# Patient Record
Sex: Female | Born: 1995 | Race: White | Hispanic: No | Marital: Single | Smoking: Former smoker
Health system: Southern US, Community
[De-identification: ages and names within clinical notes are randomized; demographics above are authoritative.]

## PROBLEM LIST (undated history)

## (undated) ENCOUNTER — Ambulatory Visit: Admission: EM | Payer: Commercial Managed Care - PPO | Source: Home / Self Care

## (undated) DIAGNOSIS — F32A Depression, unspecified: Secondary | ICD-10-CM

## (undated) DIAGNOSIS — F329 Major depressive disorder, single episode, unspecified: Secondary | ICD-10-CM

## (undated) DIAGNOSIS — F319 Bipolar disorder, unspecified: Secondary | ICD-10-CM

## (undated) DIAGNOSIS — F419 Anxiety disorder, unspecified: Secondary | ICD-10-CM

## (undated) HISTORY — DX: Depression, unspecified: F32.A

## (undated) HISTORY — DX: Major depressive disorder, single episode, unspecified: F32.9

## (undated) HISTORY — DX: Anxiety disorder, unspecified: F41.9

---
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
## 2016-01-01 ENCOUNTER — Emergency Department
Admission: EM | Admit: 2016-01-01 | Discharge: 2016-01-01 | Disposition: A | Discharge status: Home / Self Care | Payer: BC Managed Care – PPO | Attending: Student in an Organized Health Care Education/Training Program | Admitting: Student in an Organized Health Care Education/Training Program

## 2016-01-01 ENCOUNTER — Emergency Department: Payer: BC Managed Care – PPO

## 2016-01-01 ENCOUNTER — Encounter: Payer: Self-pay | Admitting: Emergency Medicine

## 2016-01-01 DIAGNOSIS — T22012A Burn of unspecified degree of left forearm, initial encounter: Secondary | ICD-10-CM | POA: Insufficient documentation

## 2016-01-01 DIAGNOSIS — S0990XA Unspecified injury of head, initial encounter: Secondary | ICD-10-CM | POA: Diagnosis present

## 2016-01-01 DIAGNOSIS — Y92410 Unspecified street and highway as the place of occurrence of the external cause: Secondary | ICD-10-CM | POA: Insufficient documentation

## 2016-01-01 DIAGNOSIS — Y999 Unspecified external cause status: Secondary | ICD-10-CM | POA: Insufficient documentation

## 2016-01-01 DIAGNOSIS — S0093XA Contusion of unspecified part of head, initial encounter: Secondary | ICD-10-CM

## 2016-01-01 DIAGNOSIS — Z79899 Other long term (current) drug therapy: Secondary | ICD-10-CM | POA: Diagnosis not present

## 2016-01-01 DIAGNOSIS — Y9389 Activity, other specified: Secondary | ICD-10-CM | POA: Insufficient documentation

## 2016-01-01 DIAGNOSIS — S20219A Contusion of unspecified front wall of thorax, initial encounter: Secondary | ICD-10-CM | POA: Diagnosis not present

## 2016-01-01 MED ORDER — CYCLOBENZAPRINE HCL 10 MG PO TABS: 10.0000 mg | ORAL_TABLET | Freq: Three times a day (TID) | ORAL | 0 refills | Status: DC | PRN

## 2016-01-01 MED ORDER — NAPROXEN 500 MG PO TABS: 500.0000 mg | ORAL_TABLET | Freq: Two times a day (BID) | ORAL | 0 refills | Status: DC
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
## 2016-02-29 ENCOUNTER — Emergency Department: Payer: BC Managed Care – PPO

## 2016-02-29 ENCOUNTER — Emergency Department
Admission: EM | Admit: 2016-02-29 | Discharge: 2016-02-29 | Disposition: A | Discharge status: Home / Self Care | Payer: BC Managed Care – PPO | Attending: Emergency Medicine | Admitting: Emergency Medicine

## 2016-02-29 ENCOUNTER — Encounter: Payer: Self-pay | Admitting: Emergency Medicine

## 2016-02-29 DIAGNOSIS — R05 Cough: Secondary | ICD-10-CM | POA: Diagnosis present

## 2016-02-29 DIAGNOSIS — M940 Chondrocostal junction syndrome [Tietze]: Secondary | ICD-10-CM | POA: Diagnosis not present

## 2016-02-29 DIAGNOSIS — J157 Pneumonia due to Mycoplasma pneumoniae: Secondary | ICD-10-CM | POA: Diagnosis not present

## 2016-02-29 MED ORDER — AZITHROMYCIN 250 MG PO TABS: ORAL_TABLET | ORAL | 0 refills | Status: DC

## 2016-02-29 MED ORDER — HYDROCODONE-ACETAMINOPHEN 5-325 MG PO TABS: 1.0000 | ORAL_TABLET | ORAL | 0 refills | Status: DC | PRN

## 2016-02-29 MED ORDER — DEXAMETHASONE SODIUM PHOSPHATE 10 MG/ML IJ SOLN
20.0000 mg | Freq: Once | INTRAMUSCULAR | Status: AC
  Administered 2016-02-29: 20 mg via INTRAMUSCULAR
  Filled 2016-02-29: qty 2

## 2016-02-29 MED ORDER — BENZONATATE 200 MG PO CAPS: 200.0000 mg | ORAL_CAPSULE | Freq: Four times a day (QID) | ORAL | 0 refills | Status: DC | PRN

## 2016-02-29 MED ORDER — PREDNISONE 10 MG PO TABS: 10.0000 mg | ORAL_TABLET | Freq: Every day | ORAL | 0 refills | Status: DC

## 2016-02-29 NOTE — ED Notes (Signed)
Xray was negative, ok to come to flex per [employer]johnathan

## 2016-02-29 NOTE — ED Triage Notes (Signed)
Pt has had cough X 2 weeks. Was initially productive but now dry cough. Pt reports was having cough fit today and felt a pop in right mid axillary rib area.  Pain with inspiration now.  No distress at this time but appears to be in pain.

## 2016-02-29 NOTE — ED Provider Notes (Signed)
[facility] Emergency Department Provider Note  ____________________________________________  Time seen: Approximately 7:06 PM  I have reviewed the triage vital signs and the nursing notes.   HISTORY  Chief Complaint Cough    HPI Sue Jones is a 20 y.o. female who presents emergency department complaining of cough 2-3 weeks and sharp right rib pain. Patient states that she has had a dry cough 2-3 weeks. Initially patient had some low-grade fevers, nasal congestions, sore throat. Those symptoms resolved and she has been coughing for the intervening period. Patient states that today she had a coughing spell that resulted in a sharp/popping sensation to the right lateral ribs. Patient states that she has now had sharp pain with inspiration. She reports that she is able to palpate region of her ribs. She denies any difficulty breathing or swallowing at this time. No fevers or chills today. No medications prior to arrival.Patient is tearful emergency department due to pain.   History reviewed. No pertinent past medical history.  There are no active problems to display for this patient.   History reviewed. No pertinent surgical history.  Prior to Admission medications   Medication Sig Start Date End Date Taking? Authorizing Provider  albuterol (PROVENTIL HFA;VENTOLIN HFA) 108 (90 Base) MCG/ACT inhaler Inhale 2 puffs into the lungs every 4 (four) hours as needed for wheezing or shortness of breath. 12/11/15   Delorise RoyalsJonathan D Adelle Zachar, PA-C  cyclobenzaprine (FLEXERIL) 10 MG tablet Take 1 tablet (10 mg total) by mouth 3 (three) times daily as needed for muscle spasms. 01/01/16   Evangeline Dakinharles M Beers, PA-C  naproxen (NAPROSYN) 500 MG tablet Take 1 tablet (500 mg total) by mouth 2 (two) times daily with a meal. 01/01/16   Evangeline Dakinharles M Beers, PA-C  predniSONE (DELTASONE) 50 MG tablet Take 1 tablet (50 mg total) by mouth daily with breakfast. 12/11/15   Delorise RoyalsJonathan D Ravindra Baranek, PA-C     Allergies Patient has no known allergies.  History reviewed. No pertinent family history.  Social History Social History  Substance Use Topics  . Smoking status: Never Smoker  . Smokeless tobacco: Never Used  . Alcohol use No     Review of Systems  Constitutional: No fever/chills Eyes: No visual changes. No discharge ENT: No upper respiratory complaints. Cardiovascular: no chest pain. Respiratory: Positive cough. No SOB. Gastrointestinal: No abdominal pain.  No nausea, no vomiting.  Musculoskeletal: As above for right lateral rib pain Skin: Negative for rash, abrasions, lacerations, ecchymosis. Neurological: Negative for headaches, focal weakness or numbness. 10-point ROS otherwise negative.  ____________________________________________   PHYSICAL EXAM:  VITAL SIGNS: ED Triage Vitals  Enc Vitals Group     BP 02/29/16 1633 (!) 144/84     Pulse Rate 02/29/16 1632 93     Resp 02/29/16 1632 20     Temp 02/29/16 1632 98.4 F (36.9 C)     Temp Source 02/29/16 1632 Oral     SpO2 02/29/16 1632 100 %     Weight 02/29/16 1633 127 lb (57.6 kg)     Height 02/29/16 1633 5\' 6"  (1.676 m)     Head Circumference --      Peak Flow --      Pain Score 02/29/16 1633 7     Pain Loc --      Pain Edu? --      Excl. in GC? --      Constitutional: Alert and oriented. Well appearing and in no acute distress. Eyes: Conjunctivae are normal. PERRL. EOMI.  Head: Atraumatic. ENT:      Ears: EACs and TMs unremarkable bilaterally.      Nose: No congestion/rhinnorhea.      Mouth/Throat: Mucous membranes are moist.  Neck: No stridor. Neck is supple with full range of motion Hematological/Lymphatic/Immunilogical: No cervical lymphadenopathy. Cardiovascular: Normal rate, regular rhythm. Normal S1 and S2.  Good peripheral circulation. Respiratory: Normal respiratory effort without tachypnea or retractions. Lungs CTAB. Good air entry to the bases with no decreased or absent breath  sounds. Musculoskeletal: Full range of motion to all extremities. No gross deformities appreciated. No visible deformities noted to ribs. Equal chest rise and fall. No paradoxical chest wall movement or flail segments. Patient is tender in the intercostal region of the eighth through 10th ribs right lateral rib cage. No palpable abnormality. Good underlying breath sounds. Neurologic:  Normal speech and language. No gross focal neurologic deficits are appreciated.  Skin:  Skin is warm, dry and intact. No rash noted. Psychiatric: Mood and affect are normal. Speech and behavior are normal. Patient exhibits appropriate insight and judgement.   ____________________________________________   LABS (all labs ordered are listed, but only abnormal results are displayed)  Labs Reviewed - No data to display ____________________________________________  EKG   ____________________________________________  RADIOLOGY Festus BarrenI, Aayan Haskew D Gilberta Peeters, personally viewed and evaluated these images (plain radiographs) as part of my medical decision making, as well as reviewing the written report by the radiologist.  Dg Chest 2 View  Result Date: 02/29/2016 CLINICAL DATA:  Cough for 2 weeks. EXAM: CHEST  2 VIEW COMPARISON:  01/01/2016 FINDINGS: The heart size and mediastinal contours are within normal limits. Both lungs are clear. The visualized skeletal structures are unremarkable. IMPRESSION: No active cardiopulmonary disease. Electronically Signed   By: Richarda OverlieAdam  Henn M.D.   On: 02/29/2016 16:59    ____________________________________________    PROCEDURES  Procedure(s) performed:    Procedures    Medications - No data to display   ____________________________________________   INITIAL IMPRESSION / ASSESSMENT AND PLAN / ED COURSE  Pertinent labs & imaging results that were available during my care of the patient were reviewed by me and considered in my medical decision making (see chart for  details).  Review of the [place] CSRS was performed in accordance of the [facility] prior to dispensing any controlled drugs.  Clinical Course     Patient's diagnosis is consistent with Mycoplasma pneumonia. Patient has had dry cough 3 weeks. Coughing has been increasing over the last several days. Patient now has acute costochondritis the right lateral ribs. X-ray was performed which returned with no acute cardiopulmonary abnormality. Patient will be treated with injection of steroids in the emergency department and discharged home with antibiotics, steroids, cough medication, limited pain medication.. Patient will follow-up with primary care as needed. Patient is given ED precautions to return to the ED for any worsening or new symptoms.     ____________________________________________  FINAL CLINICAL IMPRESSION(S) / ED DIAGNOSES  Final diagnoses:  None      NEW MEDICATIONS STARTED DURING THIS VISIT:  New Prescriptions   No medications on file        This chart was dictated using voice recognition software/Dragon. Despite best efforts to proofread, errors can occur which can change the meaning. Any change was purely unintentional.    Racheal PatchesJonathan D Ingri Diemer, PA-C 02/29/16 1930    Jeanmarie PlantJames A McShane, MD 02/29/16 2024

## 2016-02-29 NOTE — ED Notes (Signed)
See triage note  Cough for 2 weeks   No fever  Today over the past couple of days has been dry,non prod  But initially the cough was prod

## 2017-01-06 ENCOUNTER — Ambulatory Visit (INDEPENDENT_AMBULATORY_CARE_PROVIDER_SITE_OTHER): Payer: 59 | Admitting: Family Medicine

## 2017-01-06 ENCOUNTER — Encounter: Payer: Self-pay | Admitting: Family Medicine

## 2017-01-06 VITALS — BP 130/83 | HR 85 | Temp 98.9°F | Ht 66.5 in | Wt 126.0 lb

## 2017-01-06 DIAGNOSIS — Z716 Tobacco abuse counseling: Secondary | ICD-10-CM

## 2017-01-06 DIAGNOSIS — Z720 Tobacco use: Secondary | ICD-10-CM | POA: Diagnosis not present

## 2017-01-06 DIAGNOSIS — F332 Major depressive disorder, recurrent severe without psychotic features: Secondary | ICD-10-CM | POA: Diagnosis not present

## 2017-01-06 DIAGNOSIS — F419 Anxiety disorder, unspecified: Secondary | ICD-10-CM

## 2017-01-06 DIAGNOSIS — Z7689 Persons encountering health services in other specified circumstances: Secondary | ICD-10-CM

## 2017-01-06 MED ORDER — CITALOPRAM HYDROBROMIDE 10 MG PO TABS: 10.0000 mg | ORAL_TABLET | Freq: Every day | ORAL | 1 refills | Status: DC

## 2017-01-06 NOTE — Patient Instructions (Addendum)
[email or web address]  [facility] Smoking Cessation classes - FREE!!   [facility] in [place] is always available if needed!

## 2017-01-06 NOTE — Progress Notes (Signed)
BP 130/83   Pulse 85   Temp 98.9 F (37.2 C)   Ht 5' 6.5" (1.689 m)   Wt 126 lb (57.2 kg)   LMP  (LMP Unknown)   SpO2 100%   BMI 20.03 kg/m    Subjective:    Patient ID: Sue Jones, female    DOB: 1995/08/25, 21 y.o.   MRN: [id number]  HPI: Sue Jones is a 21 y.o. female  Chief Complaint  Patient presents with  . Establish Care  . Depression    has had a history of depression and anxiety, was on meds in the past. Would like a referral for therapists.  . Anxiety   Patient presents today to establish care. Diagnosed at age 71 with anxiety and depression. Used to take xanax for anxiety but is scared of BZDs, has tried cymbalta with adverse reaction of suicidality with attempt in the past. Was on celexa after that with good results. Wanting to consider going back on that as well as some counseling. No active SI/HI, but notes passive thoughts in the last 6 months with no plan. Notes her sxs have started to affect her daily life.   Also states she is a smoker and wants badly to quit. Has passively tried gum and patches but no lasting luck.   No other concerns today or PMHx that she is aware of.   Relevant past medical, surgical, family and social history reviewed and updated as indicated. Interim medical history since our last visit reviewed. Allergies and medications reviewed and updated.  Review of Systems  Constitutional: Negative.   HENT: Negative.   Respiratory: Negative.   Cardiovascular: Negative.   Gastrointestinal: Negative.   Musculoskeletal: Negative.   Skin: Negative.   Neurological: Negative.   Psychiatric/Behavioral: Positive for dysphoric mood, sleep disturbance and suicidal ideas. The patient is nervous/anxious.    Per HPI unless specifically indicated above     Objective:    BP 130/83   Pulse 85   Temp 98.9 F (37.2 C)   Ht 5' 6.5" (1.689 m)   Wt 126 lb (57.2 kg)   LMP  (LMP Unknown)   SpO2 100%   BMI 20.03 kg/m   Wt Readings from Last  3 Encounters:  01/06/17 126 lb (57.2 kg)  02/29/16 127 lb (57.6 kg) (48 %, Z= -0.04)*  01/01/16 128 lb (58.1 kg) (51 %, Z= 0.02)*   * Growth percentiles are based on CDC 2-20 Years data.    Physical Exam  Constitutional: She is oriented to person, place, and time. She appears well-developed and well-nourished.  HENT:  Head: Atraumatic.  Eyes: Pupils are equal, round, and reactive to light. Conjunctivae are normal.  Neck: Normal range of motion. Neck supple.  Cardiovascular: Normal rate and normal heart sounds.   Pulmonary/Chest: Effort normal and breath sounds normal. No respiratory distress.  Musculoskeletal: Normal range of motion.  Neurological: She is alert and oriented to person, place, and time.  Skin: Skin is warm and dry.  Psychiatric: She has a normal mood and affect. Her behavior is normal.  Nursing note and vitals reviewed.  No results found for this or any previous visit.    Assessment & Plan:   Problem List Items Addressed This Visit      Other   Depression    Will restart celexa after long discussion regarding past hx of SI on anti-depressants given her good response to them previously. Crisis center information as well as [facility] walk-in information given.  Pt adamant that she is not currently suicidal nor has her passive thoughts become serious or planned the past year. Discussed importance of going to ER right away when these thoughts occur. Will get counseling on board as well as work on getting the right medications going. F/u in 1 month for recheck      Relevant Medications   citalopram (CELEXA) 10 MG tablet   Anxiety    List of local counselors given, will start celexa back as well. Discussed importance of regular exercise and balanced diet in helping to manage stress/anxiety sxs, calming music, essential oils, sleep hygiene.       Relevant Medications   citalopram (CELEXA) 10 MG tablet    Other Visit Diagnoses    Encounter to establish care    -  Primary    Encounter for smoking cessation counseling       Discussed that safest options currently were gum, patches, substitution with hard candy during smoke breaks, and free [facility] Cessation courses. Info given       Follow up plan: Return in about 4 weeks (around 02/03/2017) for CPE, depression and anxiety f/u.

## 2017-01-09 DIAGNOSIS — F419 Anxiety disorder, unspecified: Secondary | ICD-10-CM | POA: Insufficient documentation

## 2017-01-09 NOTE — Assessment & Plan Note (Signed)
List of local counselors given, will start celexa back as well. Discussed importance of regular exercise and balanced diet in helping to manage stress/anxiety sxs, calming music, essential oils, sleep hygiene.

## 2017-01-09 NOTE — Assessment & Plan Note (Signed)
Will restart celexa after long discussion regarding past hx of SI on anti-depressants given her good response to them previously. Crisis center information as well as [facility] walk-in information given. Pt adamant that she is not currently suicidal nor has her passive thoughts become serious or planned the past year. Discussed importance of going to ER right away when these thoughts occur. Will get counseling on board as well as work on getting the right medications going. F/u in 1 month for recheck

## 2017-02-03 ENCOUNTER — Encounter: Payer: Self-pay | Admitting: Family Medicine

## 2017-02-03 ENCOUNTER — Ambulatory Visit (INDEPENDENT_AMBULATORY_CARE_PROVIDER_SITE_OTHER): Payer: 59 | Admitting: Family Medicine

## 2017-02-03 VITALS — BP 124/80 | HR 68 | Temp 98.2°F | Ht 66.5 in | Wt 129.0 lb

## 2017-02-03 DIAGNOSIS — F332 Major depressive disorder, recurrent severe without psychotic features: Secondary | ICD-10-CM | POA: Diagnosis not present

## 2017-02-03 DIAGNOSIS — Z114 Encounter for screening for human immunodeficiency virus [HIV]: Secondary | ICD-10-CM

## 2017-02-03 DIAGNOSIS — Z0000 Encounter for general adult medical examination without abnormal findings: Secondary | ICD-10-CM | POA: Diagnosis not present

## 2017-02-04 LAB — CBC WITH DIFFERENTIAL/PLATELET
BASOS ABS: 0 10*3/uL (ref 0.0–0.2)
BASOS: 1 %
EOS (ABSOLUTE): 0.1 10*3/uL (ref 0.0–0.4)
Eos: 1 %
Hematocrit: 40.1 % (ref 34.0–46.6)
Hemoglobin: 13.2 g/dL (ref 11.1–15.9)
IMMATURE GRANS (ABS): 0 10*3/uL (ref 0.0–0.1)
IMMATURE GRANULOCYTES: 0 %
LYMPHS: 35 %
Lymphocytes Absolute: 2.1 10*3/uL (ref 0.7–3.1)
MCH: 27.9 pg (ref 26.6–33.0)
MCHC: 32.9 g/dL (ref 31.5–35.7)
MCV: 85 fL (ref 79–97)
Monocytes Absolute: 0.4 10*3/uL (ref 0.1–0.9)
Monocytes: 7 %
NEUTROS PCT: 56 %
Neutrophils Absolute: 3.4 10*3/uL (ref 1.4–7.0)
PLATELETS: 241 10*3/uL (ref 150–379)
RBC: 4.73 x10E6/uL (ref 3.77–5.28)
RDW: 14.7 % (ref 12.3–15.4)
WBC: 6 10*3/uL (ref 3.4–10.8)

## 2017-02-04 LAB — COMPREHENSIVE METABOLIC PANEL
ALT: 13 IU/L (ref 0–32)
AST: 14 IU/L (ref 0–40)
Albumin/Globulin Ratio: 2.3 — ABNORMAL HIGH (ref 1.2–2.2)
Albumin: 4.5 g/dL (ref 3.5–5.5)
Alkaline Phosphatase: 81 IU/L (ref 39–117)
BUN/Creatinine Ratio: 8 — ABNORMAL LOW (ref 9–23)
BUN: 7 mg/dL (ref 6–20)
Bilirubin Total: 0.4 mg/dL (ref 0.0–1.2)
CALCIUM: 9.3 mg/dL (ref 8.7–10.2)
CO2: 24 mmol/L (ref 20–29)
CREATININE: 0.85 mg/dL (ref 0.57–1.00)
Chloride: 107 mmol/L — ABNORMAL HIGH (ref 96–106)
GFR, EST AFRICAN AMERICAN: 114 mL/min/{1.73_m2} (ref >59)
GFR, EST NON AFRICAN AMERICAN: 99 mL/min/{1.73_m2} (ref >59)
Globulin, Total: 2 g/dL (ref 1.5–4.5)
Glucose: 85 mg/dL (ref 65–99)
Potassium: 4.4 mmol/L (ref 3.5–5.2)
Sodium: 143 mmol/L (ref 134–144)
TOTAL PROTEIN: 6.5 g/dL (ref 6.0–8.5)

## 2017-02-04 LAB — TSH: TSH: 1.24 u[IU]/mL (ref 0.450–4.500)

## 2017-02-04 LAB — HIV ANTIBODY (ROUTINE TESTING W REFLEX): HIV Screen 4th Generation wRfx: NONREACTIVE

## 2017-02-04 LAB — LIPID PANEL W/O CHOL/HDL RATIO
Cholesterol, Total: 131 mg/dL (ref 100–199)
HDL: 54 mg/dL (ref >39)
LDL CALC: 66 mg/dL (ref 0–99)
Triglycerides: 54 mg/dL (ref 0–149)
VLDL Cholesterol Cal: 11 mg/dL (ref 5–40)

## 2017-02-05 NOTE — Assessment & Plan Note (Signed)
Tolerating Celexa well. Wanting to give this dose a few more months before deciding whether to change the dose. Continue current regimen

## 2017-02-05 NOTE — Progress Notes (Signed)
BP 124/80   Pulse 68   Temp 98.2 F (36.8 C)   Ht 5' 6.5" (1.689 m)   Wt 129 lb (58.5 kg)   LMP  (LMP Unknown)   SpO2 99%   BMI 20.51 kg/m    Subjective:    Patient ID: Sue Jones, female    DOB: 02-10-1996, 21 y.o.   MRN: [id number]  HPI: Sue Jones is a 21 y.o. female presenting on 02/03/2017 for comprehensive medical examination. Current medical complaints include:1 month f/u after restarting celexa for her anxiety/depression. Doing fairly well so far, feels that there has been some increased stress in her life the past month but still feeling improvement. No side effects, especially no SI/HI as this has been an issue in the past.   Fasting today for labs.   Depression Screen done today and results listed below:  Depression screen [facility] 2/9 02/03/2017 01/06/2017  Decreased Interest 3 3  Down, Depressed, Hopeless 2 2  PHQ - 2 Score 5 5  Altered sleeping 3 3  Tired, decreased energy 3 3  Change in appetite 3 1  Feeling bad or failure about yourself  1 3  Trouble concentrating 2 0  Moving slowly or fidgety/restless 0 3  Suicidal thoughts 2 1  PHQ-9 Score 19 19    The patient does not have a history of falls. I did not complete a risk assessment for falls. A plan of care for falls was not documented.   Past Medical History:  Past Medical History:  Diagnosis Date  . Anxiety   . Depression     Surgical History:  History reviewed. No pertinent surgical history.  Medications:  Current Outpatient Prescriptions on File Prior to Visit  Medication Sig  . citalopram (CELEXA) 10 MG tablet Take 1 tablet (10 mg total) by mouth daily.  . Etonogestrel (NEXPLANON [place]) Inject into the skin.   No current facility-administered medications on file prior to visit.     Allergies:  Allergies  Allergen Reactions  . Cymbalta [Duloxetine Hcl]     Suicide attempt    Social History:  Social History   Social History  . Marital status: Single    Spouse name: N/A  .  Number of children: N/A  . Years of education: N/A   Occupational History  . Not on file.   Social History Main Topics  . Smoking status: Current Every Day Smoker    Packs/day: 0.50    Types: Cigarettes  . Smokeless tobacco: Never Used  . Alcohol use No  . Drug use: No  . Sexual activity: Not on file   Other Topics Concern  . Not on file   Social History Narrative  . No narrative on file   History  Smoking Status  . Current Every Day Smoker  . Packs/day: 0.50  . Types: Cigarettes  Smokeless Tobacco  . Never Used   History  Alcohol Use No    Family History:  Family History  Problem Relation Age of Onset  . Cancer Maternal Grandmother   . Heart disease Maternal Grandmother   . Cancer Maternal Grandfather   . Heart disease Maternal Grandfather   . COPD Neg Hx   . Diabetes Neg Hx   . Hypertension Neg Hx   . Stroke Neg Hx     Past medical history, surgical history, medications, allergies, family history and social history reviewed with patient today and changes made to appropriate areas of the chart.   Review of  Systems - General ROS: negative Psychological ROS: positive for - anxiety and depression ENT ROS: negative Breast ROS: negative for breast lumps Respiratory ROS: no cough, shortness of breath, or wheezing Cardiovascular ROS: no chest pain or dyspnea on exertion Gastrointestinal ROS: no abdominal pain, change in bowel habits, or black or bloody stools Genito-Urinary ROS: no dysuria, trouble voiding, or hematuria Musculoskeletal ROS: negative Neurological ROS: no TIA or stroke symptoms Dermatological ROS: negative All other ROS negative except what is listed above and in the HPI.      Objective:    BP 124/80   Pulse 68   Temp 98.2 F (36.8 C)   Ht 5' 6.5" (1.689 m)   Wt 129 lb (58.5 kg)   LMP  (LMP Unknown)   SpO2 99%   BMI 20.51 kg/m   Wt Readings from Last 3 Encounters:  02/03/17 129 lb (58.5 kg)  01/06/17 126 lb (57.2 kg)  02/29/16 127  lb (57.6 kg) (48 %, Z= -0.04)*   * Growth percentiles are based on CDC 2-20 Years data.    Physical Exam  Constitutional: She is oriented to person, place, and time. She appears well-developed and well-nourished. No distress.  HENT:  Head: Atraumatic.  Right Ear: External ear normal.  Left Ear: External ear normal.  Nose: Nose normal.  Mouth/Throat: Oropharynx is clear and moist. No oropharyngeal exudate.  Eyes: Pupils are equal, round, and reactive to light. Conjunctivae are normal. No scleral icterus.  Neck: Normal range of motion. Neck supple. No thyromegaly present.  Cardiovascular: Normal rate, regular rhythm, normal heart sounds and intact distal pulses.   Pulmonary/Chest: Effort normal and breath sounds normal. No respiratory distress.  Abdominal: Soft. Bowel sounds are normal. She exhibits no mass. There is no tenderness.  Musculoskeletal: Normal range of motion. She exhibits no edema or tenderness.  Lymphadenopathy:    She has no cervical adenopathy.  Neurological: She is alert and oriented to person, place, and time. No cranial nerve deficit.  Skin: Skin is warm and dry. No rash noted.  Psychiatric: She has a normal mood and affect. Her behavior is normal.  Nursing note and vitals reviewed.  Results for orders placed or performed in visit on 02/03/17  HIV antibody (with reflex)  Result Value Ref Range   HIV Screen 4th Generation wRfx Non Reactive Non Reactive  CBC with Differential/Platelet  Result Value Ref Range   WBC 6.0 3.4 - 10.8 x10E3/uL   RBC 4.73 3.77 - 5.28 x10E6/uL   Hemoglobin 13.2 11.1 - 15.9 g/dL   Hematocrit 45.4 09.8 - 46.6 %   MCV 85 79 - 97 fL   MCH 27.9 26.6 - 33.0 pg   MCHC 32.9 31.5 - 35.7 g/dL   RDW 11.9 14.7 - 82.9 %   Platelets 241 150 - 379 x10E3/uL   Neutrophils 56 Not Estab. %   Lymphs 35 Not Estab. %   Monocytes 7 Not Estab. %   Eos 1 Not Estab. %   Basos 1 Not Estab. %   Neutrophils Absolute 3.4 1.4 - 7.0 x10E3/uL   Lymphocytes  Absolute 2.1 0.7 - 3.1 x10E3/uL   Monocytes Absolute 0.4 0.1 - 0.9 x10E3/uL   EOS (ABSOLUTE) 0.1 0.0 - 0.4 x10E3/uL   Basophils Absolute 0.0 0.0 - 0.2 x10E3/uL   Immature Granulocytes 0 Not Estab. %   Immature Grans (Abs) 0.0 0.0 - 0.1 x10E3/uL  Comprehensive metabolic panel  Result Value Ref Range   Glucose 85 65 - 99 mg/dL  BUN 7 6 - 20 mg/dL   Creatinine, Ser 3.080.85 0.57 - 1.00 mg/dL   GFR calc non Af Amer 99 >59 mL/min/1.73   GFR calc Af Amer 114 >59 mL/min/1.73   BUN/Creatinine Ratio 8 (L) 9 - 23   Sodium 143 134 - 144 mmol/L   Potassium 4.4 3.5 - 5.2 mmol/L   Chloride 107 (H) 96 - 106 mmol/L   CO2 24 20 - 29 mmol/L   Calcium 9.3 8.7 - 10.2 mg/dL   Total Protein 6.5 6.0 - 8.5 g/dL   Albumin 4.5 3.5 - 5.5 g/dL   Globulin, Total 2.0 1.5 - 4.5 g/dL   Albumin/Globulin Ratio 2.3 (H) 1.2 - 2.2   Bilirubin Total 0.4 0.0 - 1.2 mg/dL   Alkaline Phosphatase 81 39 - 117 IU/L   AST 14 0 - 40 IU/L   ALT 13 0 - 32 IU/L  Lipid Panel w/o Chol/HDL Ratio  Result Value Ref Range   Cholesterol, Total 131 100 - 199 mg/dL   Triglycerides 54 0 - 149 mg/dL   HDL 54 >65>39 mg/dL   VLDL Cholesterol Cal 11 5 - 40 mg/dL   LDL Calculated 66 0 - 99 mg/dL  TSH  Result Value Ref Range   TSH 1.240 0.450 - 4.500 uIU/mL      Assessment & Plan:   Problem List Items Addressed This Visit      Other   Depression - Primary    Tolerating Celexa well. Wanting to give this dose a few more months before deciding whether to change the dose. Continue current regimen       Other Visit Diagnoses    Annual physical exam       Await fasting lab results. UTD on health maintenance   Relevant Orders   CBC with Differential/Platelet (Completed)   Comprehensive metabolic panel (Completed)   Lipid Panel w/o Chol/HDL Ratio (Completed)   UA/M w/rflx Culture, Routine   TSH (Completed)   Screening for HIV (human immunodeficiency virus)       Relevant Orders   HIV antibody (with reflex) (Completed)       Follow  up plan: Return in about 3 months (around 05/06/2017) for Depression f/u.   LABORATORY TESTING:  - Pap smear: not applicable  IMMUNIZATIONS:   - Tdap: Tetanus vaccination status reviewed: last tetanus booster within 10 years. - Influenza: Refused  PATIENT COUNSELING:   Advised to take 1 mg of folate supplement per day if capable of pregnancy.   Sexuality: Discussed sexually transmitted diseases, partner selection, use of condoms, avoidance of unintended pregnancy  and contraceptive alternatives.   Advised to avoid cigarette smoking.  I discussed with the patient that most people either abstain from alcohol or drink within safe limits (<=14/week and <=4 drinks/occasion for males, <=7/weeks and <= 3 drinks/occasion for females) and that the risk for alcohol disorders and other health effects rises proportionally with the number of drinks per week and how often a drinker exceeds daily limits.  Discussed cessation/primary prevention of drug use and availability of treatment for abuse.   Diet: Encouraged to adjust caloric intake to maintain  or achieve ideal body weight, to reduce intake of dietary saturated fat and total fat, to limit sodium intake by avoiding high sodium foods and not adding table salt, and to maintain adequate dietary potassium and calcium preferably from fresh fruits, vegetables, and low-fat dairy products.    stressed the importance of regular exercise  Injury prevention: Discussed safety belts, safety helmets,  smoke detector, smoking near bedding or upholstery.   Dental health: Discussed importance of regular tooth brushing, flossing, and dental visits.    NEXT PREVENTATIVE PHYSICAL DUE IN 1 YEAR. Return in about 3 months (around 05/06/2017) for Depression f/u.

## 2017-02-05 NOTE — Patient Instructions (Signed)
Follow up in 3 months

## 2017-02-06 LAB — UA/M W/RFLX CULTURE, ROUTINE
BILIRUBIN UA: NEGATIVE
GLUCOSE, UA: NEGATIVE
KETONES UA: NEGATIVE
Leukocytes, UA: NEGATIVE
Nitrite, UA: NEGATIVE
PROTEIN UA: NEGATIVE
SPEC GRAV UA: 1.025 (ref 1.005–1.030)
Urobilinogen, Ur: 0.2 mg/dL (ref 0.2–1.0)
pH, UA: 5.5 (ref 5.0–7.5)

## 2017-02-12 ENCOUNTER — Other Ambulatory Visit: Payer: Self-pay | Admitting: Family Medicine

## 2017-03-08 ENCOUNTER — Telehealth: Payer: Self-pay

## 2017-03-08 MED ORDER — CITALOPRAM HYDROBROMIDE 20 MG PO TABS: 20.0000 mg | ORAL_TABLET | Freq: Every day | ORAL | 1 refills | Status: DC

## 2017-03-08 NOTE — Telephone Encounter (Signed)
Will send in increased dose and recheck at upcoming appt if going well

## 2017-03-08 NOTE — Telephone Encounter (Signed)
Fax from pharmacy.  Patient is requested a increase on her Celexa. Please advise.

## 2017-03-28 ENCOUNTER — Encounter: Payer: Self-pay | Admitting: Unknown Physician Specialty

## 2017-03-28 ENCOUNTER — Ambulatory Visit (INDEPENDENT_AMBULATORY_CARE_PROVIDER_SITE_OTHER): Payer: 59 | Admitting: Unknown Physician Specialty

## 2017-03-28 DIAGNOSIS — R45851 Suicidal ideations: Secondary | ICD-10-CM | POA: Diagnosis not present

## 2017-03-28 DIAGNOSIS — F39 Unspecified mood [affective] disorder: Secondary | ICD-10-CM | POA: Insufficient documentation

## 2017-03-28 DIAGNOSIS — F322 Major depressive disorder, single episode, severe without psychotic features: Secondary | ICD-10-CM | POA: Diagnosis not present

## 2017-03-28 DIAGNOSIS — F339 Major depressive disorder, recurrent, unspecified: Secondary | ICD-10-CM | POA: Insufficient documentation

## 2017-03-28 MED ORDER — LITHIUM CARBONATE 300 MG PO TABS: 300.0000 mg | ORAL_TABLET | Freq: Two times a day (BID) | ORAL | 1 refills | Status: DC

## 2017-03-28 NOTE — Assessment & Plan Note (Addendum)
Pt is with someone at all times.  Her friend does not feel she is at risk.  She agrees to call for help if another incident. Given her support between her friend, boy friend and self insight, I feel she is at low risk of self harm.  No guns in house

## 2017-03-28 NOTE — Assessment & Plan Note (Addendum)
?   Bipolar depression.  Discussed with pt.  She is willing to try Lithium as a mood stabilizer.  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  She is currently interviewing psychiatrists but willing for me to put in a consult

## 2017-03-28 NOTE — Progress Notes (Signed)
BP 132/84   Pulse 72   Temp 98.4 F (36.9 C) (Oral)   Wt 130 lb 6.4 oz (59.1 kg)   SpO2 100%   BMI 20.73 kg/m    Subjective:    Patient ID: Sue MedinSabrina D Piper, female    DOB: [birth year], 21 y.o.   MRN: [id number]  HPI: Sue Jones is a 21 y.o. female  Chief Complaint  Patient presents with  . Depression    pt states she is having side effects from citalopram    Pt is here with her best friend Sam.  She is having a great deal of problems with depression and mood swings.  States the biggest problem is her agitation and irritability.  Having loss of appetite and her friend states her sleep is "weird" and get much less sleep than usual.  States she did have to be "restrained" to no jump out a window that was not high up.  Her friend or her boyfriend are with her at all times and she and her friend agree to go to the ER in case of another incident.  State her father had severe depression spells and mother had been diagnosed with Schitzophrenia.    [facility] 03/28/2017 02/03/2017 01/06/2017  Decreased Interest 3 3 3   Down, Depressed, Hopeless 3 2 2   PHQ - 2 Score 6 5 5   Altered sleeping 1 3 3   Tired, decreased energy 3 3 3   Change in appetite 3 3 1   Feeling bad or failure about yourself  - 1 3  Trouble concentrating 1 2 0  Moving slowly or fidgety/restless 3 0 3  Suicidal thoughts 3 2 1   PHQ-9 Score 20 19 19    MDQ with 10/14 positive answers.    Relevant past medical, surgical, family and social history reviewed and updated as indicated. Interim medical history since our last visit reviewed. Allergies and medications reviewed and updated.  Review of Systems  Constitutional: Negative.   Respiratory: Negative.   Cardiovascular: Negative.   Gastrointestinal: Negative.   Skin: Negative.     Per HPI unless specifically indicated above     Objective:    BP 132/84   Pulse 72   Temp 98.4 F (36.9 C) (Oral)   Wt 130 lb 6.4 oz (59.1 kg)   SpO2 100%   BMI  20.73 kg/m   Wt Readings from Last 3 Encounters:  03/28/17 130 lb 6.4 oz (59.1 kg)  02/03/17 129 lb (58.5 kg)  01/06/17 126 lb (57.2 kg)    Physical Exam  Constitutional: She is oriented to person, place, and time. She appears well-developed and well-nourished. No distress.  HENT:  Head: Normocephalic and atraumatic.  Eyes: Conjunctivae and lids are normal. Right eye exhibits no discharge. Left eye exhibits no discharge. No scleral icterus.  Neck: Normal range of motion. Neck supple. No JVD present. Carotid bruit is not present.  Cardiovascular: Normal rate, regular rhythm and normal heart sounds.  Pulmonary/Chest: Effort normal and breath sounds normal.  Abdominal: Normal appearance. There is no splenomegaly or hepatomegaly.  Musculoskeletal: Normal range of motion.  Neurological: She is alert and oriented to person, place, and time.  Skin: Skin is warm, dry and intact. No rash noted. No pallor.  Psychiatric: She has a normal mood and affect. Her behavior is normal. Judgment and thought content normal.      Assessment & Plan:   Problem List Items Addressed This Visit      Unprioritized   Depression,  major, single episode, severe (HCC)    ? Bipolar depression.  Discussed with pt.  She is willing to try Lithium as a mood stabilizer.  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  She is currently interviewing psychiatrists but willing for me to put in a consult      Relevant Orders   Ambulatory referral to Psychiatry   Suicidal ideation    Pt is with someone at all times.  Her friend does not feel she is at risk.  She agrees to call for help if another incident. Given her support between her friend, boy friend and self insight, I feel she is at low risk of self harm.  No guns in house      Relevant Orders   Ambulatory referral to Psychiatry      Greater than 50% of this  25 minute visit was spent in counseling/coordination of  care regarding her depression.    Follow up plan: Return for Has appointment set up in January.

## 2017-05-08 ENCOUNTER — Ambulatory Visit (INDEPENDENT_AMBULATORY_CARE_PROVIDER_SITE_OTHER): Payer: 59 | Admitting: Family Medicine

## 2017-05-08 ENCOUNTER — Encounter: Payer: Self-pay | Admitting: Family Medicine

## 2017-05-08 VITALS — BP 123/78 | HR 90 | Temp 98.3°F | Wt 134.9 lb

## 2017-05-08 DIAGNOSIS — F339 Major depressive disorder, recurrent, unspecified: Secondary | ICD-10-CM

## 2017-05-08 MED ORDER — ONDANSETRON HCL 4 MG PO TABS: 4.0000 mg | ORAL_TABLET | Freq: Three times a day (TID) | ORAL | 0 refills | Status: DC | PRN

## 2017-05-08 MED ORDER — CITALOPRAM HYDROBROMIDE 20 MG PO TABS: 20.0000 mg | ORAL_TABLET | Freq: Every day | ORAL | 2 refills | Status: DC

## 2017-05-08 MED ORDER — CARIPRAZINE HCL 1.5 MG PO CAPS: 1.5000 mg | ORAL_CAPSULE | Freq: Every day | ORAL | 0 refills | Status: DC

## 2017-05-08 NOTE — Progress Notes (Signed)
   BP 123/78   Pulse 90   Temp 98.3 F (36.8 C) (Oral)   Wt 134 lb 14.4 oz (61.2 kg)   SpO2 97%   BMI 21.45 kg/m    Subjective:    Patient ID: Sue Jones, female    DOB: 1996/01/20, 22 y.o.   MRN: [id number]  HPI: Sue Jones is a 22 y.o. female  Chief Complaint  Patient presents with  . Depression    3 month f/up    Pt here today for 3 month depression f/u. Has been on celexa, and lithium was added at previous appt. Moods much improved on lithium, but pt states singificant nausea with each dose as well as an ongoing period x 3 months where she usually has no period as she is on the nexplanon. These side effects are intolerable for pt and she wishes to switch off. Denies SI/HI since starting, and significant other also notes improvements in moods.   Relevant past medical, surgical, family and social history reviewed and updated as indicated. Interim medical history since our last visit reviewed. Allergies and medications reviewed and updated.  Review of Systems  Constitutional: Negative.   HENT: Negative.   Respiratory: Negative.   Cardiovascular: Negative.   Gastrointestinal: Positive for nausea.  Genitourinary: Positive for menstrual problem.  Musculoskeletal: Negative.   Neurological: Negative.   Psychiatric/Behavioral: Positive for dysphoric mood.    Per HPI unless specifically indicated above     Objective:    BP 123/78   Pulse 90   Temp 98.3 F (36.8 C) (Oral)   Wt 134 lb 14.4 oz (61.2 kg)   SpO2 97%   BMI 21.45 kg/m   Wt Readings from Last 3 Encounters:  05/08/17 134 lb 14.4 oz (61.2 kg)  03/28/17 130 lb 6.4 oz (59.1 kg)  02/03/17 129 lb (58.5 kg)    Physical Exam  Constitutional: She is oriented to person, place, and time. She appears well-developed and well-nourished. No distress.  Neck: Normal range of motion. Neck supple.  Cardiovascular: Normal rate and normal heart sounds.  Pulmonary/Chest: Effort normal and breath sounds normal. No  respiratory distress.  Musculoskeletal: Normal range of motion.  Neurological: She is alert and oriented to person, place, and time.  Skin: Skin is warm and dry.  Psychiatric: She has a normal mood and affect. Her behavior is normal. Judgment and thought content normal.  Nursing note and vitals reviewed.     Assessment & Plan:   Problem List Items Addressed This Visit      Other   Depression, recurrent (HCC) - Primary    Significant side effects with lithium, though did note benefit to moods and alleviation of SI. Will taper lithium and start vraylar. Zofran sent in case continued nausea with doses. F/u in 1 month      Relevant Medications   citalopram (CELEXA) 20 MG tablet       Follow up plan: Return in about 4 weeks (around 06/05/2017) for Bipolar depression.

## 2017-05-11 ENCOUNTER — Encounter: Payer: Self-pay | Admitting: Family Medicine

## 2017-05-11 NOTE — Patient Instructions (Signed)
Follow up in 1 month   

## 2017-05-11 NOTE — Assessment & Plan Note (Signed)
Significant side effects with lithium, though did note benefit to moods and alleviation of SI. Will taper lithium and start vraylar. Zofran sent in case continued nausea with doses. F/u in 1 month

## 2017-05-15 ENCOUNTER — Telehealth: Payer: Self-pay | Admitting: Family Medicine

## 2017-05-15 NOTE — Telephone Encounter (Signed)
Pt  Calling  About  Prior  Authorization   For  Vraylar        LOV 05/08/2017        RX  WRITTEN  05/08/2017       PHARMACY  OF  CHOICE  IS    [facility]

## 2017-05-15 NOTE — Telephone Encounter (Signed)
PA has already been submitted and pending review.

## 2017-05-15 NOTE — Telephone Encounter (Signed)
Copied from CRM [id number]. Topic: Quick Communication - Rx Refill/Question >> May 15, 2017  9:47 AM Everardo [place], Chauncey Bruno, [place] wrote: Medication: Vraylar   Has the patient contacted their pharmacy?Yes   Patient calling to check the status of the prior authorization for the Vraylar medication.Stated that she sent the request over last week. Still hasn't received her medication   [facility]with phone number or street name[facility] [place] [address] [phone]   Agent: Please be advised that RX refills may take up to 3 business days. We ask that you follow-up with your pharmacy.

## 2017-05-16 NOTE — Telephone Encounter (Signed)
Fax for further information to get medication approved from insurance. Fleet ContrasRachel has filled out and it has been faxed back. Will wait for determination.

## 2017-05-16 NOTE — Telephone Encounter (Signed)
Need DX code and question #8 was left unanswered. Please advise. Call back [phone]

## 2017-05-17 ENCOUNTER — Telehealth: Payer: Self-pay

## 2017-05-17 MED ORDER — CITALOPRAM HYDROBROMIDE 20 MG PO TABS: 20.0000 mg | ORAL_TABLET | Freq: Every day | ORAL | 1 refills | Status: DC

## 2017-05-17 NOTE — Telephone Encounter (Signed)
Patient notified

## 2017-05-17 NOTE — Telephone Encounter (Signed)
PA was approved. Tried calling patient, no answer. No VM box set up yet.

## 2017-05-17 NOTE — Telephone Encounter (Signed)
90day supply sent.

## 2017-05-17 NOTE — Telephone Encounter (Signed)
Fax from pharmacy. 90 day supply request for Citalopram 20mg 

## 2017-05-18 ENCOUNTER — Telehealth: Payer: Self-pay | Admitting: Family Medicine

## 2017-05-18 NOTE — Telephone Encounter (Signed)
Copied from CRM [id number]. Topic: Quick Communication - Rx Refill/Question >> May 18, 2017  3:53 PM Maia PettiesOrtiz, Kristie S wrote: Medication: Leafy KindleVraylar - pt called stating that the recommended dosing is 1.5mg  day 1 and increase to 3mg  each day after.  Pt is requesting call to discuss. Has the patient contacted their pharmacy? Yes.   Preferred Pharmacy (with phone number or street name): [id number] [place], [place] - [address] [phone] (Phone) [phone] (Fax)  Agent: Please be advised that RX refills may take up to 3 business days. We ask that you follow-up with your pharmacy.

## 2017-05-19 MED ORDER — CARIPRAZINE HCL 3 MG PO CAPS
3.0000 mg | ORAL_CAPSULE | Freq: Every day | ORAL | 1 refills | Status: DC
Start: 2017-05-19 — End: 2017-05-30

## 2017-05-19 NOTE — Telephone Encounter (Signed)
Tried calling.  No VM set up for patient.  Will try again later.

## 2017-05-19 NOTE — Telephone Encounter (Signed)
Tried returning pt's call regarding dose change. If she's tolerating it well, she can go ahead and take 2 of them to equal 3 mg and I will get a 3 mg supply sent over for her for once she runs out of those.

## 2017-05-20 ENCOUNTER — Other Ambulatory Visit: Payer: Self-pay | Admitting: Unknown Physician Specialty

## 2017-05-22 NOTE — Telephone Encounter (Signed)
lithium refill Last OV: 05/08/17 Last Refill:04/23/17 [facility]

## 2017-05-22 NOTE — Telephone Encounter (Signed)
Needs check

## 2017-05-22 NOTE — Telephone Encounter (Signed)
Patient has upcoming appointment 06/12/17.

## 2017-05-23 NOTE — Telephone Encounter (Signed)
Called pt, she came off the lithium without issue and has increased to 3 mg on the vraylar and tolerating it well so far. Did not feel like the 1.5 mg dose was helpful, doing better on 3. No side effects. Will have her follow up as scheduled

## 2017-05-28 ENCOUNTER — Other Ambulatory Visit: Payer: Self-pay

## 2017-05-28 ENCOUNTER — Emergency Department
Admission: EM | Admit: 2017-05-28 | Discharge: 2017-05-28 | Disposition: A | Discharge status: Home / Self Care | Payer: 59 | Attending: Emergency Medicine | Admitting: Emergency Medicine

## 2017-05-28 DIAGNOSIS — F17210 Nicotine dependence, cigarettes, uncomplicated: Secondary | ICD-10-CM | POA: Insufficient documentation

## 2017-05-28 DIAGNOSIS — F329 Major depressive disorder, single episode, unspecified: Secondary | ICD-10-CM | POA: Diagnosis present

## 2017-05-28 DIAGNOSIS — Z79899 Other long term (current) drug therapy: Secondary | ICD-10-CM | POA: Insufficient documentation

## 2017-05-28 DIAGNOSIS — F319 Bipolar disorder, unspecified: Secondary | ICD-10-CM | POA: Insufficient documentation

## 2017-05-28 DIAGNOSIS — R4586 Emotional lability: Secondary | ICD-10-CM

## 2017-05-28 DIAGNOSIS — F1290 Cannabis use, unspecified, uncomplicated: Secondary | ICD-10-CM | POA: Diagnosis not present

## 2017-05-28 HISTORY — DX: Bipolar disorder, unspecified: F31.9

## 2017-05-28 LAB — URINE DRUG SCREEN, QUALITATIVE (ARMC ONLY)
Amphetamines, Ur Screen: NOT DETECTED
BARBITURATES, UR SCREEN: NOT DETECTED
BENZODIAZEPINE, UR SCRN: NOT DETECTED
CANNABINOID 50 NG, UR ~~LOC~~: POSITIVE — AB
Cocaine Metabolite,Ur ~~LOC~~: NOT DETECTED
MDMA (Ecstasy)Ur Screen: NOT DETECTED
Methadone Scn, Ur: NOT DETECTED
OPIATE, UR SCREEN: NOT DETECTED
PHENCYCLIDINE (PCP) UR S: NOT DETECTED
Tricyclic, Ur Screen: NOT DETECTED

## 2017-05-28 LAB — ETHANOL

## 2017-05-28 LAB — COMPREHENSIVE METABOLIC PANEL
ALK PHOS: 84 U/L (ref 38–126)
ALT: 18 U/L (ref 14–54)
AST: 24 U/L (ref 15–41)
Albumin: 5 g/dL (ref 3.5–5.0)
Anion gap: 11 (ref 5–15)
BUN: 12 mg/dL (ref 6–20)
CALCIUM: 9.5 mg/dL (ref 8.9–10.3)
CO2: 23 mmol/L (ref 22–32)
CREATININE: 0.84 mg/dL (ref 0.44–1.00)
Chloride: 103 mmol/L (ref 101–111)
GFR calc Af Amer: >60 mL/min (ref >60)
GFR calc non Af Amer: >60 mL/min (ref >60)
Glucose, Bld: 82 mg/dL (ref 65–99)
Potassium: 3.7 mmol/L (ref 3.5–5.1)
SODIUM: 137 mmol/L (ref 135–145)
Total Bilirubin: 0.6 mg/dL (ref 0.3–1.2)
Total Protein: 8.6 g/dL — ABNORMAL HIGH (ref 6.5–8.1)

## 2017-05-28 LAB — CBC
HEMATOCRIT: 44.4 % (ref 35.0–47.0)
Hemoglobin: 14.4 g/dL (ref 12.0–16.0)
MCH: 27.6 pg (ref 26.0–34.0)
MCHC: 32.5 g/dL (ref 32.0–36.0)
MCV: 84.8 fL (ref 80.0–100.0)
PLATELETS: 287 10*3/uL (ref 150–440)
RBC: 5.24 MIL/uL — ABNORMAL HIGH (ref 3.80–5.20)
RDW: 14.8 % — AB (ref 11.5–14.5)
WBC: 9.9 10*3/uL (ref 3.6–11.0)

## 2017-05-28 LAB — SALICYLATE LEVEL: Salicylate Lvl: <7 mg/dL (ref 2.8–30.0)

## 2017-05-28 LAB — POCT PREGNANCY, URINE: Preg Test, Ur: NEGATIVE

## 2017-05-28 LAB — ACETAMINOPHEN LEVEL: Acetaminophen (Tylenol), Serum: <10 ug/mL — ABNORMAL LOW (ref 10–30)

## 2017-05-28 MED ORDER — NICOTINE 14 MG/24HR TD PT24
14.0000 mg | MEDICATED_PATCH | Freq: Once | TRANSDERMAL | Status: DC
Start: 2017-05-28 — End: 2017-05-28
  Administered 2017-05-28: 14 mg via TRANSDERMAL
  Filled 2017-05-28: qty 1

## 2017-05-28 MED ORDER — LORAZEPAM 1 MG PO TABS
1.0000 mg | ORAL_TABLET | Freq: Once | ORAL | Status: AC
Start: 2017-05-28 — End: 2017-05-28
  Administered 2017-05-28: 1 mg via ORAL
  Filled 2017-05-28: qty 1

## 2017-05-28 NOTE — Discharge Instructions (Signed)
Follow closely with your doctor first thing tomorrow for further help with your medication, if you have thoughts of hurting yourself or anyone else, as you have contracted to do, please return to the emergency room.  If necessary, please call 911.

## 2017-05-28 NOTE — ED Notes (Signed)
SOC called voluntary  [id number]

## 2017-05-28 NOTE — ED Notes (Signed)
TTS at bedside. 

## 2017-05-28 NOTE — ED Notes (Signed)
pts belongings including her clothing, purse and jewelry has been sent home with her friend Weston Brassick

## 2017-05-28 NOTE — BH Assessment (Signed)
Assessment Note  Sue Jones is an 22 y.o. female who presented to [facility] ED due to severe mood swings and change of medications (unsure if meds are working).  Pt. denies si/hi, AVH and substance use.  Pt. states she has recently changed medications and is concerned about the new meds effectiveness.  Pt. lives independently and will return to her apartment.  Pt. reports previous diagnoses of Anxiety, Depression, and Bipolar I D/O. Pt. 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 Pt. denies previous hospitalizations and outpatient treatment. Pt. reports her PCP, Dr. Evalyn Cascoachael Lane has written prescriptions for her current meds of Celexa and Vraylar.   Pt. recommended making contact with her private insurance provider to make connection to an outpatient provider.    Diagnosis: Bipolar I D/O   Past Medical History:  Past Medical History:  Diagnosis Date  . Anxiety   . Bipolar 1 disorder (HCC)   . Depression     No past surgical history on file.  Family History:  Family History  Problem Relation Age of Onset  . Cancer Maternal Grandmother   . Heart disease Maternal Grandmother   . Cancer Maternal Grandfather   . Heart disease Maternal Grandfather   . COPD Neg Hx   . Diabetes Neg Hx   . Hypertension Neg Hx   . Stroke Neg Hx     Social History:  reports that she has been smoking cigarettes.  She has been smoking about 0.30 packs per day. she has never used smokeless tobacco. She reports that she does not drink alcohol or use drugs.  Additional Social History:  Alcohol / Drug Use Pain Medications: See MAR Prescriptions: See MAR Over the Counter: See MAR History of alcohol / drug use?: No history of alcohol / drug abuse  CIWA: CIWA-Ar BP: 129/76 Pulse Rate: 76 COWS:    Allergies:  Allergies  Allergen Reactions  . Cymbalta [Duloxetine Hcl]     Suicide attempt    Home Medications:  (Not in a hospital admission)  OB/GYN Status:  No LMP recorded.  Patient has had an implant.  General Assessment Data Location of Assessment[facility] ED TTS Assessment: In system Is this a Tele or Face-to-Face Assessment?: Face-to-Face Is this an Initial Assessment or a Re-assessment for this encounter?: Initial Assessment Marital status: Single Maiden name: Sherilyn CooterHenry Is patient pregnant?: No Pregnancy Status: No Living Arrangements: Alone Can pt return to current living arrangement?: Yes Admission Status: Voluntary Is patient capable of signing voluntary admission?: Yes Referral Source: Self/Family/Friend Insurance type: Research officer, [employer]  Medical Screening Exam [facility] Walk-in ONLY) Medical Exam completed: Yes  Crisis Care Plan Living Arrangements: Alone Name of Psychiatrist: N/A Name of Therapist: N/A  Education Status Is patient currently in school?: No Current Grade: N/A Highest grade of school patient has completed: 12th Name of school: N/A Contact person: N/A  Risk to self with the past 6 months Suicidal Ideation: No Has patient been a risk to self within the past 6 months prior to admission? : No Suicidal Intent: No Has patient had any suicidal intent within the past 6 months prior to admission? : No Is patient at risk for suicide?: No Suicidal Plan?: No Has patient had any suicidal plan within the past 6 months prior to admission? : No Access to Means: No What has been your use of drugs/alcohol within the last 12 months?: none Previous Attempts/Gestures: No How many times?: 0 Other Self Harm Risks: none Triggers for Past Attempts: Unknown  Intentional Self Injurious Behavior: None Family Suicide History: No Recent stressful life event(s): Financial Problems Persecutory voices/beliefs?: No Depression: Yes Depression Symptoms: Fatigue, Loss of interest in usual pleasures Substance abuse history and/or treatment for substance abuse?: No Suicide prevention information given to non-admitted patients: Not applicable  Risk to Others  within the past 6 months Homicidal Ideation: No Does patient have any lifetime risk of violence toward others beyond the six months prior to admission? : No Thoughts of Harm to Others: No Current Homicidal Intent: No Current Homicidal Plan: No Access to Homicidal Means: No Identified Victim: N/A History of harm to others?: No Assessment of Violence: None Noted Violent Behavior Description: None Does patient have access to weapons?: No Criminal Charges Pending?: No Does patient have a court date: No Is patient on probation?: No  Psychosis Hallucinations: None noted Delusions: None noted  Mental Status Report Appearance/Hygiene: In scrubs Eye Contact: Good Motor Activity: Unremarkable Speech: Soft, Slow Level of Consciousness: Quiet/awake Mood: Worthless, low self-esteem Affect: Sad Anxiety Level: Minimal Thought Processes: Coherent Judgement: Unimpaired Orientation: Appropriate for developmental age Obsessive Compulsive Thoughts/Behaviors: None  Cognitive Functioning Concentration: Fair Memory: Recent Intact IQ: Average Insight: Fair Impulse Control: Fair Appetite: Fair Weight Loss: 0 Weight Gain: 0 Sleep: Increased Total Hours of Sleep: 10 Vegetative Symptoms: None  ADLScreening [facility] Assessment Services) Patient's cognitive ability adequate to safely complete daily activities?: Yes Patient able to express need for assistance with ADLs?: Yes Independently performs ADLs?: Yes (appropriate for developmental age)  Prior Inpatient Therapy Prior Inpatient Therapy: No  Prior Outpatient Therapy Prior Outpatient Therapy: No Does patient have an ACCT team?: No Does patient have Intensive In-House Services?  : No Does patient have [facility] services? : No Does patient have P4CC services?: No  ADL Screening (condition at time of admission) Patient's cognitive ability adequate to safely complete daily activities?: Yes Is the patient deaf or have difficulty hearing?:  No Does the patient have difficulty seeing, even when wearing glasses/contacts?: No Does the patient have difficulty concentrating, remembering, or making decisions?: No Patient able to express need for assistance with ADLs?: Yes Does the patient have difficulty dressing or bathing?: No Independently performs ADLs?: Yes (appropriate for developmental age) Does the patient have difficulty walking or climbing stairs?: No Weakness of Legs: None Weakness of Arms/Hands: None  Home Assistive Devices/Equipment Home Assistive Devices/Equipment: None  Therapy Consults (therapy consults require a physician order) PT Evaluation Needed: No OT Evalulation Needed: No SLP Evaluation Needed: No Abuse/Neglect Assessment (Assessment to be complete while patient is alone) Abuse/Neglect Assessment Can Be Completed: Yes Physical Abuse: Denies Verbal Abuse: Denies Sexual Abuse: Denies Exploitation of patient/patient's resources: Denies Self-Neglect: Denies Values / Beliefs Cultural Requests During Hospitalization: None Spiritual Requests During Hospitalization: None Consults Spiritual Care Consult Needed: No Social Work Consult Needed: No      Additional Information 1:1 In Past 12 Months?: No CIRT Risk: No Elopement Risk: No Does patient have medical clearance?: Yes     Disposition:  Disposition Initial Assessment Completed for this Encounter: Yes Disposition of Patient: Discharge with Outpatient Resources(advised pt. to contact insurance provider for opt resources)  On Site Evaluation by:   Reviewed with Physician:    Ellery Plunk 05/28/2017 4:53 PM

## 2017-05-28 NOTE — ED Notes (Signed)
Ride will be here around 1630-1700

## 2017-05-28 NOTE — ED Provider Notes (Addendum)
[facility] Emergency Department Provider Note  ____________________________________________   I have reviewed the triage vital signs and the nursing notes. Where available I have reviewed prior notes and, if possible and indicated, outside hospital notes.    HISTORY  Chief Complaint Medical Clearance and Depression    HPI Sue Jones is a 22 y.o. female of anxiety bipolar and self harming but never suicidal attempts in the past she states.  Presents today stating that she feels that she is not well calibrated on her psych medication.  She was on lithium until a few weeks ago at which time because of vaginal bleeding and other concerns, she went off lithium and is being titrated up on another medication.  Vraylar, she was started on 3 mg nightly, and she has had some mood lability.  She states yesterday she felt depressed.  She did not feel that she would harm herself and she "knew she would not" but she wants some help with her medication and her mood swings.  She states she has been cycling a little bit high and a little bit low over the last couple days as her lithium wears off.  She has not taken lithium for several weeks.  She is not suicidal at this time.  She is not homicidal.  She does not have a psychiatrist, she states that her primary care doctor takes care of these things for her.  At this time, she has no acute complaints.  She denies being abused at home, sexually or physically or emotionally.  He has no HI either and she denies command hallucinations   Past Medical History:  Diagnosis Date  . Anxiety   . Bipolar 1 disorder (HCC)   . Depression     Patient Active Problem List   Diagnosis Date Noted  . Depression, recurrent (HCC) 03/28/2017  . Suicidal ideation 03/28/2017  . Anxiety 01/09/2017    No past surgical history on file.  Prior to Admission medications   Medication Sig Start Date End Date Taking? Authorizing Provider  albuterol  (VENTOLIN HFA) 108 (90 Base) MCG/ACT inhaler Inhale 2 puffs into the lungs every 6 (six) hours as needed. 05/06/17 05/06/18  [provider]  cariprazine (VRAYLAR) capsule Take 1 capsule (3 mg total) by mouth daily. 05/19/17   Particia Nearing, PA-C  citalopram (CELEXA) 20 MG tablet Take 1 tablet (20 mg total) by mouth daily. 05/17/17   Particia Nearing, PA-C  Etonogestrel [facility]) Inject into the skin.    [provider]  fluticasone (FLONASE) 50 MCG/ACT nasal spray Place 1 spray into both nostrils daily. 05/06/17 05/06/18  [provider]  lithium carbonate 300 MG capsule TAKE 1 CAPSULE (300 MG TOTAL) BY MOUTH 2 (TWO) TIMES DAILY AT 10 AM AND 5 PM. 05/22/17   Gabriel Cirri, NP  ondansetron (ZOFRAN) 4 MG tablet Take 1 tablet (4 mg total) by mouth every 8 (eight) hours as needed for nausea or vomiting. 05/08/17   Particia Nearing, PA-C    Allergies Cymbalta [duloxetine hcl]  Family History  Problem Relation Age of Onset  . Cancer Maternal Grandmother   . Heart disease Maternal Grandmother   . Cancer Maternal Grandfather   . Heart disease Maternal Grandfather   . COPD Neg Hx   . Diabetes Neg Hx   . Hypertension Neg Hx   . Stroke Neg Hx     Social History Social History   Tobacco Use  . Smoking status: Current Every  Day Smoker    Packs/day: 0.30    Types: Cigarettes  . Smokeless tobacco: Never Used  Substance Use Topics  . Alcohol use: No  . Drug use: No    Review of Systems Constitutional: No fever/chills Eyes: No visual changes. ENT: No sore throat. No stiff neck no neck pain Cardiovascular: Denies chest pain. Respiratory: Denies shortness of breath. Gastrointestinal:   no vomiting.  No diarrhea.  No constipation. Genitourinary: Negative for dysuria. Musculoskeletal: Negative lower extremity swelling Skin: Negative for rash. Neurological: Negative for severe headaches, focal weakness or  numbness.   ____________________________________________   PHYSICAL EXAM:  VITAL SIGNS: ED Triage Vitals  Enc Vitals Group     BP 05/28/17 1215 136/76     Pulse Rate 05/28/17 1215 83     Resp 05/28/17 1215 18     Temp 05/28/17 1215 98.5 F (36.9 C)     Temp Source 05/28/17 1215 Oral     SpO2 05/28/17 1215 100 %     Weight 05/28/17 1216 134 lb (60.8 kg)     Height 05/28/17 1216 5\' 6"  (1.676 m)     Head Circumference --      Peak Flow --      Pain Score --      Pain Loc --      Pain Edu? --      Excl. in GC? --     Constitutional: Alert and oriented. Well appearing and in no acute distress. Eyes: Conjunctivae are normal Head: Atraumatic HEENT: No congestion/rhinnorhea. Mucous membranes are moist.  Oropharynx non-erythematous Neck:   Nontender with no meningismus, no masses, no stridor Cardiovascular: Normal rate, regular rhythm. Grossly normal heart sounds.  Good peripheral circulation. Respiratory: Normal respiratory effort.  No retractions. Lungs CTAB. Abdominal: Soft and nontender. No distention. No guarding no rebound Back:  There is no focal tenderness or step off.  there is no midline tenderness there are no lesions noted. there is no CVA tenderness Musculoskeletal: No lower extremity tenderness, no upper extremity tenderness. No joint effusions, no DVT signs strong distal pulses no edema Neurologic:  Normal speech and language. No gross focal neurologic deficits are appreciated.  Skin:  Skin is warm, dry and intact. No rash noted. Psychiatric: Mood and affect are normal. Speech and behavior are normal.  ____________________________________________   LABS (all labs ordered are listed, but only abnormal results are displayed)  Labs Reviewed  COMPREHENSIVE METABOLIC PANEL - Abnormal; Notable for the following components:      Result Value   Total Protein 8.6 (*)    All other components within normal limits  ACETAMINOPHEN LEVEL - Abnormal; Notable for the  following components:   Acetaminophen (Tylenol), Serum <10 (*)    All other components within normal limits  CBC - Abnormal; Notable for the following components:   RBC 5.24 (*)    RDW 14.8 (*)    All other components within normal limits  URINE DRUG SCREEN, QUALITATIVE ([facility] ONLY) - Abnormal; Notable for the following components:   Cannabinoid 50 Ng, Ur  POSITIVE (*)    All other components within normal limits  ETHANOL  SALICYLATE LEVEL  POC URINE PREG, ED  POCT PREGNANCY, URINE    Pertinent labs  results that were available during my care of the patient were reviewed by me and considered in my medical decision making (see chart for details). ____________________________________________  EKG  I personally interpreted any EKGs ordered by me or triage  ____________________________________________  RADIOLOGY  Pertinent  labs & imaging results that were available during my care of the patient were reviewed by me and considered in my medical decision making (see chart for details). If possible, patient and/or family made aware of any abnormal findings.  No results found. ____________________________________________    PROCEDURES  Procedure(s) performed: None  Procedures  Critical Care performed: None  ____________________________________________   INITIAL IMPRESSION / ASSESSMENT AND PLAN / ED COURSE  Pertinent labs & imaging results that were available during my care of the patient were reviewed by me and considered in my medical decision making (see chart for details).  Patient seen and evaluated in the emergency department for mood swings, she has no SI or HI she does contract for safety, she is having her home meds altered.  This is certainly not something that requires IVC so far as I can tell however, I did asked psychiatry to talk to the patient.  Per report, psychiatry is going to make recommendations after talking to her and it is my hope that we can get her  safely home.  Again, patient does contract for safety and has no further complaints.  We will await information from psychiatric consult, and she is signed out at the end of my shift to Dr. Derrill Kay  ----------------------------------------- 3:31 PM on 05/28/2017 -----------------------------------------  Patient was seen by psychiatry, no SI no HI contracts for safety, they do not recommend anything but discharge they did want me to give her a single milligram of Ativan here.  Patient contracts for safety, denies any thoughts of hurting herself contracts for safety and will go home.     ____________________________________________   FINAL CLINICAL IMPRESSION(S) / ED DIAGNOSES  Final diagnoses:  None      This chart was dictated using voice recognition software.  Despite best efforts to proofread,  errors can occur which can change meaning.      Jeanmarie Plant, MD 05/28/17 1513    Jeanmarie Plant, MD 05/28/17 [phone]

## 2017-05-28 NOTE — ED Triage Notes (Signed)
She arrives today with reports of beginning a new medicine - Vraylar two weeks ago (it took the place of Lithium that she had been taking for approx one month)  She verbalizes depression and thought of harming herself last night  "I was having a breakdown and I thought of harming myself but I cried myself to sleep instead."

## 2017-05-30 ENCOUNTER — Telehealth: Payer: Self-pay | Admitting: Family Medicine

## 2017-05-30 MED ORDER — ONDANSETRON HCL 4 MG PO TABS: 4.0000 mg | ORAL_TABLET | Freq: Three times a day (TID) | ORAL | 0 refills | Status: DC | PRN

## 2017-05-30 MED ORDER — LITHIUM CARBONATE 300 MG PO CAPS: ORAL_CAPSULE | ORAL | 1 refills | Status: DC

## 2017-05-30 NOTE — Telephone Encounter (Signed)
Called pt, she is open to trying other medications in the future but for right now just wants to return to the lithium and deal with the side effects in order to return to a good baseline affect. The vraylar caused significant mania that caused her to seek emergency care twice. Will send more zofran to help with the nausea from lithium. Will recheck as scheduled and discuss future management

## 2017-05-30 NOTE — Telephone Encounter (Signed)
Copied from CRM [id number]. Topic: Quick Communication - See Telephone Encounter >> May 30, 2017 10:37 AM Landry MellowFoltz, Melissa J wrote: CRM for notification. See Telephone encounter for:   05/30/17. Pt states that the cariprazine (VRAYLAR) capsule is not working well to get her back to base line.  She would like to switch back to lithium tomorrow.  She thinks she has refills on it but would like you to send some in in case she does not have refills.  [facility] [place]  Cb# [phone]

## 2017-06-02 ENCOUNTER — Telehealth: Payer: Self-pay | Admitting: Family Medicine

## 2017-06-02 NOTE — Telephone Encounter (Signed)
Patient notified. Has an appointment at the end of the month with her psychiatrist.

## 2017-06-02 NOTE — Telephone Encounter (Signed)
We do not do these types of certifications, she would need to see a Psychiatrist to discuss that type of thing   Copied from CRM [id number]. Topic: Quick Communication - See Telephone Encounter >> Jun 02, 2017  2:08 PM Elliot GaultBell, Tiffany M wrote:  [phone] for notification. See Telephone encounter for:   06/02/17.   Relation to pt: self Call back number: [phone]  Reason for call:   Patient states she will be moving and would like PCP to write a letter for her new property manager reflecting the need of patient service dog. Patient states depending on when the letter is done she can pick up or fax, patient would like a call when letter is completed, please advise >> Jun 02, 2017  2:14 PM Elliot GaultBell, Tiffany M wrote:  [phone] for notification. See Telephone encounter for:   06/02/17.   Relation to pt: self Call back number: [phone]  Reason for call:   Patient states she will be moving and would like PCP to write a letter for her new property manager reflecting the need of patient service dog. Patient states depending on when the letter is done she can pick up or fax, patient would like a call when letter is completed, please advise

## 2017-06-12 ENCOUNTER — Ambulatory Visit: Payer: 59 | Admitting: Family Medicine

## 2017-06-22 DIAGNOSIS — F603 Borderline personality disorder: Secondary | ICD-10-CM | POA: Insufficient documentation

## 2017-06-22 DIAGNOSIS — F439 Reaction to severe stress, unspecified: Secondary | ICD-10-CM | POA: Insufficient documentation

## 2017-06-29 MED ORDER — ONDANSETRON 4 MG PO TBDP
4.00 mg | ORAL_TABLET | ORAL | Status: DC
Start: ? — End: 2017-06-29

## 2017-06-29 MED ORDER — HYDROXYZINE HCL 25 MG PO TABS
25.00 mg | ORAL_TABLET | ORAL | Status: DC
Start: ? — End: 2017-06-29

## 2017-06-29 MED ORDER — NICOTINE 14 MG/24HR TD PT24
1.00 | MEDICATED_PATCH | TRANSDERMAL | Status: DC
Start: 2017-06-30 — End: 2017-06-29

## 2017-06-29 MED ORDER — CITALOPRAM HYDROBROMIDE 20 MG PO TABS
20.00 mg | ORAL_TABLET | ORAL | Status: DC
Start: 2017-06-29 — End: 2017-06-29

## 2017-06-29 MED ORDER — NICOTINE POLACRILEX 2 MG MT GUM
2.00 mg | CHEWING_GUM | OROMUCOSAL | Status: DC
Start: ? — End: 2017-06-29

## 2017-06-29 MED ORDER — LITHIUM CARBONATE ER 300 MG PO TBCR
300.00 mg | EXTENDED_RELEASE_TABLET | ORAL | Status: DC
Start: 2017-06-29 — End: 2017-06-29

## 2017-06-29 MED ORDER — POLYETHYLENE GLYCOL 3350 17 G PO PACK
17.00 | PACK | ORAL | Status: DC
Start: ? — End: 2017-06-29

## 2017-06-29 MED ORDER — GENERIC EXTERNAL MEDICATION
30.00 | Status: DC
Start: ? — End: 2017-06-29

## 2017-06-29 MED ORDER — TRAZODONE HCL 50 MG PO TABS
50.00 mg | ORAL_TABLET | ORAL | Status: DC
Start: ? — End: 2017-06-29

## 2017-06-29 MED ORDER — ACETAMINOPHEN 325 MG PO TABS
650.00 mg | ORAL_TABLET | ORAL | Status: DC
Start: ? — End: 2017-06-29

## 2017-06-29 MED ORDER — MELATONIN 3 MG PO TABS
3.00 mg | ORAL_TABLET | ORAL | Status: DC
Start: ? — End: 2017-06-29

## 2017-07-15 ENCOUNTER — Other Ambulatory Visit: Payer: Self-pay | Admitting: Family Medicine

## 2017-07-17 NOTE — Telephone Encounter (Signed)
Vraylar refill Last OV: Telephone Encounter 05/30/17- med was causing mania and was d'cd at that time. Pt switched to Lithium at that time Last Refill:Not on med list

## 2017-07-21 ENCOUNTER — Other Ambulatory Visit: Payer: Self-pay

## 2017-07-21 MED ORDER — HYDROXYZINE HCL 25 MG PO TABS: 25.0000 mg | ORAL_TABLET | Freq: Three times a day (TID) | ORAL | 0 refills | Status: DC | PRN

## 2017-07-21 NOTE — Telephone Encounter (Signed)
Fax from pharmacy. Patient was in hospital for Suicidal Ideation 06/22/2017 and was giving Hydroxizine. (medication pulled from outside record). Patient is going out of town and requests a refill for medication.

## 2017-07-24 ENCOUNTER — Telehealth: Payer: Self-pay

## 2017-07-24 NOTE — Telephone Encounter (Signed)
Fax from pharmacy. Request for Vraylar 1.5mg  capsule. Not in patient's current medication list.   *On fax cover sheet states in bold "Patient expects to pick up prescription tomorrow at 11AM"

## 2017-07-24 NOTE — Telephone Encounter (Signed)
She's currently on lithium per Psychiatry during recent admission. Pt strongly requested to get off vraylar because it caused things to get much worse for her. I see that she was unable to get into an outpatient psychiatrist, if she's not seeing [facility] I'm happy to place a new referral to see if someone else will take her insurance

## 2017-07-24 NOTE — Telephone Encounter (Signed)
Called pt, was unable to leave VM. If she's being followed by Psychiatry then they should be managing her lithium - if not, I will need to know what dose she is on and if/when she last had her levels checked

## 2017-07-24 NOTE — Telephone Encounter (Signed)
Patient state pharmacy could have got confused. She requested a refill on her lithium, [facility] She said she would call them and let them know.  Asked patient about [facility] or another psychiatrist and patient states she sees a Dr. Melvenia BeamSimon in [place]. Could recall name she stated something similar to "[facility], I don't know a friend recommended it to me." I asked if it was psychology or psychiatry and patient stated psychiatry.   I couldn't find the name above with a Dr. Melvenia BeamSimon but I did find [facility] in [place] with Donell SievertSpencer Simon, PA.

## 2017-07-25 NOTE — Telephone Encounter (Signed)
Spoke with patient. Explained Psychiatry needs to manage patient's Lithium. Patient understood and agreed. She stated she has an appointment 04/16.  Explained to call us of she had any other questions or concerns.

## 2017-08-02 ENCOUNTER — Ambulatory Visit (INDEPENDENT_AMBULATORY_CARE_PROVIDER_SITE_OTHER): Payer: 59 | Admitting: Family Medicine

## 2017-08-02 ENCOUNTER — Encounter: Payer: Self-pay | Admitting: Family Medicine

## 2017-08-02 VITALS — BP 127/87 | HR 71 | Wt 141.0 lb

## 2017-08-02 DIAGNOSIS — F339 Major depressive disorder, recurrent, unspecified: Secondary | ICD-10-CM

## 2017-08-02 DIAGNOSIS — Z124 Encounter for screening for malignant neoplasm of cervix: Secondary | ICD-10-CM

## 2017-08-02 DIAGNOSIS — R3911 Hesitancy of micturition: Secondary | ICD-10-CM | POA: Diagnosis not present

## 2017-08-02 LAB — UA/M W/RFLX CULTURE, ROUTINE
Bilirubin, UA: NEGATIVE
GLUCOSE, UA: NEGATIVE
KETONES UA: NEGATIVE
LEUKOCYTES UA: NEGATIVE
Nitrite, UA: NEGATIVE
PROTEIN UA: NEGATIVE
Specific Gravity, UA: 1.02 (ref 1.005–1.030)
Urobilinogen, Ur: 0.2 mg/dL (ref 0.2–1.0)
pH, UA: 6.5 (ref 5.0–7.5)

## 2017-08-02 LAB — MICROSCOPIC EXAMINATION

## 2017-08-02 NOTE — Progress Notes (Signed)
BP 127/87   Pulse 71   Wt 141 lb (64 kg)   SpO2 98%   BMI 22.76 kg/m    Subjective:    Patient ID: Sue Jones, female    DOB: 09/10/1995, 22 y.o.   MRN: [id number]  HPI: Sue Jones is a 22 y.o. female  Chief Complaint  Patient presents with  . Urine Output    Starting urine flow and emptying bladder.    Pt here today with about a week of difficulty starting urine stream as well as difficulty fully emptying bladder. Denies dysuria, hematuria, urinary frequency, discharge, vaginal lesions, concern for STIs, abdominal pain, fevers. Has not been trying anything OTC for sxs. Recently started abilify with Psychiatrist but otherwise no other medication changes.   Also wanting to get UTD on pap smear while she's here. Thought [facility] had one at the [facility] 2 years ago when she got her nexplanon placed but unsure. No abnormal bleeding or other new sxs, no known hx of abnormal paps.   Bipolar depression going fairly well since recent admission. Now followed by Psychiatry and states current regimen going fairly well. Denies current SI/HI, self harm, paranoia, severe mood swings.   Relevant past medical, surgical, family and social history reviewed and updated as indicated. Interim medical history since our last visit reviewed. Allergies and medications reviewed and updated.  Review of Systems  Per HPI unless specifically indicated above     Objective:    BP 127/87   Pulse 71   Wt 141 lb (64 kg)   SpO2 98%   BMI 22.76 kg/m   Wt Readings from Last 3 Encounters:  08/02/17 141 lb (64 kg)  05/28/17 134 lb (60.8 kg)  05/08/17 134 lb 14.4 oz (61.2 kg)    Physical Exam  Constitutional: She is oriented to person, place, and time. She appears well-developed and well-nourished. No distress.  HENT:  Head: Atraumatic.  Eyes: Conjunctivae are normal.  Neck: Normal range of motion. Neck supple.  Cardiovascular: Normal rate and regular rhythm.  Pulmonary/Chest: Effort  normal and breath sounds normal.  Abdominal: Soft. Bowel sounds are normal.  Genitourinary: Vagina normal. Cervix exhibits no discharge and no friability. No vaginal discharge found.  Musculoskeletal: Normal range of motion. She exhibits no tenderness (No CVA tenderness b/l).  Neurological: She is alert and oriented to person, place, and time.  Skin: Skin is warm and dry.  Psychiatric: She has a normal mood and affect. Her behavior is normal.  Nursing note and vitals reviewed.   Results for orders placed or performed in visit on 08/02/17  Microscopic Examination  Result Value Ref Range   WBC, UA 0-5 0 - 5 /hpf   RBC, UA 0-2 0 - 2 /hpf   Epithelial Cells (non renal) 0-10 0 - 10 /hpf   Bacteria, UA Few None seen/Few  UA/M w/rflx Culture, Routine  Result Value Ref Range   Specific Gravity, UA 1.020 1.005 - 1.030   pH, UA 6.5 5.0 - 7.5   Color, UA Yellow Yellow   Appearance Ur Turbid (A) Clear   Leukocytes, UA Negative Negative   Protein, UA Negative Negative/Trace   Glucose, UA Negative Negative   Ketones, UA Negative Negative   RBC, UA 2+ (A) Negative   Bilirubin, UA Negative Negative   Urobilinogen, Ur 0.2 0.2 - 1.0 mg/dL   Nitrite, UA Negative Negative   Microscopic Examination See below:       Assessment & Plan:  Problem List Items Addressed This Visit      Other   Depression, recurrent (HCC)    Now followed by Psychiatry. Stable currently without active suicidality since recent admission. Continue current regimen per Psychiatry with abilify, celexa, and prn hydroxyzine       Other Visit Diagnoses    Urinary hesitancy    -  Primary   U/A without evidence of UTI, unclear whether due to low fluid intake, medications, or bladder irritation. Push fluids, avoid bladder irritants, monitor closely   Relevant Orders   UA/M w/rflx Culture, Routine (Completed)   Screening for cervical cancer       Health Dept states no record of a pap on file for her. Pap performed today.  Await results   Relevant Orders   Pap Lb, rfx HPV ASCU       Follow up plan: Return in about 1 year (around 08/03/2018) for CPE.

## 2017-08-03 NOTE — Patient Instructions (Signed)
Follow up for CPE 

## 2017-08-03 NOTE — Assessment & Plan Note (Signed)
Now followed by Psychiatry. Stable currently without active suicidality since recent admission. Continue current regimen per Psychiatry with abilify, celexa, and prn hydroxyzine

## 2017-08-04 ENCOUNTER — Other Ambulatory Visit: Payer: Self-pay | Admitting: Family Medicine

## 2017-08-07 LAB — PAP LB, RFX HPV ASCU: PAP Smear Comment: 0

## 2017-08-07 NOTE — Telephone Encounter (Signed)
LOV  08/02/17 [address] Last refill 05/06/17 Provider's name not on medication.

## 2017-08-08 ENCOUNTER — Encounter: Payer: Self-pay | Admitting: Family Medicine

## 2017-08-30 ENCOUNTER — Telehealth: Payer: Self-pay | Admitting: Family Medicine

## 2017-08-30 DIAGNOSIS — Z79899 Other long term (current) drug therapy: Secondary | ICD-10-CM

## 2017-08-30 NOTE — Telephone Encounter (Signed)
Called patient and had to specify what lab she was talking about. Patient is requesting lithium level.  Fleet Contras was giving Lithium to patient till she got in with her psych doctor Donell Sievert at [facility]. He's requesting lithium level but doesn't have a lab to do so.  Routing to Dr. Laural Benes

## 2017-08-30 NOTE — Telephone Encounter (Signed)
Copied from CRM [id number]. Topic: Quick Communication - See Telephone Encounter >> Aug 30, 2017 12:15 PM Clack, Princella Pellegrini wrote: CRM for notification. See Telephone encounter for: 08/30/17.  Pt would like a lab order put in to have her lipantil levels checked.  Please f/u with pt.

## 2017-08-31 NOTE — Telephone Encounter (Signed)
Patient notified

## 2017-08-31 NOTE — Telephone Encounter (Signed)
Order in.

## 2017-11-30 ENCOUNTER — Other Ambulatory Visit: Payer: Self-pay | Admitting: Family Medicine

## 2017-12-09 ENCOUNTER — Other Ambulatory Visit: Payer: Self-pay | Admitting: Family Medicine

## 2017-12-11 NOTE — Telephone Encounter (Signed)
Refill of Hydroxyzine  LOV 08/02/17 [address]  [facility]  #[facility] 0 [facility]ills  [id number] [place]

## 2018-01-08 ENCOUNTER — Ambulatory Visit: Payer: Self-pay | Admitting: *Deleted

## 2018-01-08 NOTE — Telephone Encounter (Signed)
Pt c/o 3 days of "burnt orange urine." Pt denies urinary urgency, frequency or pain with urination. Pt stated she had a low grade fever yesterday (99.0) but attributes this to "a bug."' Pt  Denies flank pain, blood in urine. Pt denies taking any new medications or herbal supplements or vitamins.  Care advice given. Offered an appt for Wednesday, but pt stated today was her only day off and needed to be seen today. No availability today. [facility]

## 2018-01-08 NOTE — Telephone Encounter (Signed)
Attempted to contact pt regarding symptoms; no answer at [phone]; unable to complete nurse triage at this time.

## 2018-01-08 NOTE — Telephone Encounter (Signed)
  Reason for Disposition . All other urine symptoms  Answer Assessment - Initial Assessment Questions 1. SYMPTOM: "What's the main symptom you're concerned about?" (e.g., frequency, incontinence)     Burnt orange 2. ONSET: "When did the  Orange urine  start?"     2-3 days ago 3. PAIN: "Is there any pain?" If so, ask: "How bad is it?" (Scale: 1-10; mild, moderate, severe)     no 4. CAUSE: "What do you think is causing the symptoms?"     Pt doesn't know 5. OTHER SYMPTOMS: "Do you have any other symptoms?" (e.g., fever, flank pain, blood in urine, pain with urination)     Fever yesterday 99.0 6. PREGNANCY: "Is there any chance you are pregnant?" "When was your last menstrual period?"     No LMP: nexplanon implant "its been a while"  Protocols used: [facility]

## 2018-01-08 NOTE — Telephone Encounter (Signed)
Called pt and left message to call [phone]. Will close chart at this time.

## 2018-01-16 ENCOUNTER — Other Ambulatory Visit: Payer: Self-pay | Admitting: Family Medicine

## 2018-01-16 NOTE — Telephone Encounter (Signed)
Requested Prescriptions  Pending Prescriptions Disp Refills  . citalopram (CELEXA) 20 MG tablet [Pharmacy Med Name: CITALOPRAM HBR 20 MG TABLET] 90 tablet 1    Sig: TAKE 1 TABLET BY MOUTH EVERY DAY     Psychiatry:  Antidepressants - SSRI Passed - 01/16/2018  9:36 AM      Passed - Completed PHQ-2 or PHQ-9 in the last 360 days.      Passed - Valid encounter within last 6 months    Recent Outpatient Visits          5 months ago Urinary hesitancy   [facility] Roosvelt Maser [place], [place]   8 months ago Depression, recurrent [facility])   [facility] Roosvelt Maser [place], [place]   9 months ago Depression, major, single episode, severe [facility])   [facility] Gabriel Cirri, NP   11 months ago Severe episode of recurrent major depressive disorder, without psychotic features [facility])   [facility] Particia Nearing, [place]   1 year ago Encounter to establish care   [facility], Salley Hews, [place]

## 2018-02-07 ENCOUNTER — Encounter: Payer: Self-pay | Admitting: *Deleted

## 2018-02-07 ENCOUNTER — Other Ambulatory Visit: Payer: Self-pay

## 2018-02-07 ENCOUNTER — Emergency Department
Admission: EM | Admit: 2018-02-07 | Discharge: 2018-02-07 | Disposition: A | Discharge status: Home / Self Care | Payer: 59 | Attending: Emergency Medicine | Admitting: Emergency Medicine

## 2018-02-07 DIAGNOSIS — Z79899 Other long term (current) drug therapy: Secondary | ICD-10-CM | POA: Diagnosis not present

## 2018-02-07 DIAGNOSIS — R82998 Other abnormal findings in urine: Secondary | ICD-10-CM | POA: Diagnosis not present

## 2018-02-07 DIAGNOSIS — F17210 Nicotine dependence, cigarettes, uncomplicated: Secondary | ICD-10-CM | POA: Insufficient documentation

## 2018-02-07 DIAGNOSIS — R1030 Lower abdominal pain, unspecified: Secondary | ICD-10-CM | POA: Diagnosis not present

## 2018-02-07 DIAGNOSIS — B9689 Other specified bacterial agents as the cause of diseases classified elsewhere: Secondary | ICD-10-CM

## 2018-02-07 DIAGNOSIS — N760 Acute vaginitis: Secondary | ICD-10-CM

## 2018-02-07 DIAGNOSIS — R300 Dysuria: Secondary | ICD-10-CM | POA: Diagnosis present

## 2018-02-07 LAB — URINALYSIS, COMPLETE (UACMP) WITH MICROSCOPIC
BILIRUBIN URINE: NEGATIVE
Glucose, UA: NEGATIVE mg/dL
KETONES UR: NEGATIVE mg/dL
Nitrite: NEGATIVE
PROTEIN: NEGATIVE mg/dL
Specific Gravity, Urine: >1.03 — ABNORMAL HIGH (ref 1.005–1.030)
pH: 5.5 (ref 5.0–8.0)

## 2018-02-07 LAB — WET PREP, GENITAL
Sperm: NONE SEEN
TRICH WET PREP: NONE SEEN
YEAST WET PREP: NONE SEEN

## 2018-02-07 LAB — CHLAMYDIA/NGC RT PCR (ARMC ONLY)
Chlamydia Tr: NOT DETECTED
N gonorrhoeae: NOT DETECTED

## 2018-02-07 LAB — POC URINE PREG, ED: Preg Test, Ur: NEGATIVE

## 2018-02-07 MED ORDER — METRONIDAZOLE 500 MG PO TABS: 500.0000 mg | ORAL_TABLET | Freq: Two times a day (BID) | ORAL | 0 refills | Status: DC

## 2018-02-07 MED ORDER — METRONIDAZOLE 500 MG PO TABS: 500.0000 mg | ORAL_TABLET | Freq: Two times a day (BID) | ORAL | 0 refills | Status: AC

## 2018-02-07 MED ORDER — CEPHALEXIN 500 MG PO CAPS: 500.0000 mg | ORAL_CAPSULE | Freq: Two times a day (BID) | ORAL | 0 refills | Status: AC

## 2018-02-07 MED ORDER — CEPHALEXIN 500 MG PO CAPS: 500.0000 mg | ORAL_CAPSULE | Freq: Two times a day (BID) | ORAL | 0 refills | Status: DC

## 2018-02-07 NOTE — ED Triage Notes (Addendum)
PT reporting she was recently treated for a UTI but feels as though it was never a UTI. Urine continues to have an odor. No dysuria. No fevers. PT also reporting 2 weeks of vaginal bleeding that she is concerned about. Cramping pains but no sharp stabbing pains reported.   Pain reported with use of tampons but pt also reporting the bleeding is constant but not heavy.

## 2018-02-07 NOTE — Discharge Instructions (Addendum)
Take the Flagyl as prescribed and finish the full course.  If your urine culture is positive you will get a call.  Then you should take the Keflex as well.  Return to the ER for new, worsening, persistent severe bleeding, pain, discharge, urinary symptoms, fever, flank pain, or any other new or worsening symptoms that concern you.

## 2018-02-07 NOTE — ED Notes (Signed)
Pt reports that she had miscarriage x6 months ago, no DNC by OBGYN. Pt denies follow up care. Pt had heavy bleeding x2 weeks ago that returned this week. Pt seen at PCP x1 week for UTI. Pt denies new sexual partners.

## 2018-02-07 NOTE — ED Provider Notes (Signed)
[facility] Emergency Department Provider Note ____________________________________________   First MD Initiated Contact with Patient 02/07/18 2021     (approximate)  I have reviewed the triage vital signs and the nursing notes.   HISTORY  Chief Complaint No chief complaint on file.    HPI Sue Jones is a 22 y.o. female with PMH as noted below who presents with dysuria over the last several weeks, associated with malodorous urine and urine frequency, and initially improved after she took Bactrim but now worsened.  The patient states that initially she had a foul odor to her urine and dysuria, and after urinalysis was prescribed Bactrim.  She states the symptoms resolved but then have returned over the last several days.  She denies any vaginal discharge but does report a small amount of bright red vaginal bleeding today.  She reports suprapubic discomfort but no acute abdominal pain.  Past Medical History:  Diagnosis Date  . Anxiety   . Bipolar 1 disorder (HCC)   . Depression     Patient Active Problem List   Diagnosis Date Noted  . Borderline personality disorder (HCC) 06/22/2017  . 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 Depression, recurrent (HCC) 03/28/2017  . Suicidal ideation 03/28/2017  . Anxiety 01/09/2017    History reviewed. No pertinent surgical history.  Prior to Admission medications   Medication Sig Start Date End Date Taking? Authorizing Provider  ARIPiprazole (ABILIFY) 2 MG tablet Take 2 mg by mouth daily.    [provider]  citalopram (CELEXA) 20 MG tablet TAKE 1 TABLET BY MOUTH EVERY DAY 01/16/18   Particia Nearing, PA-C  Etonogestrel [facility]) Inject into the skin.    [provider]  fluticasone (FLONASE) 50 MCG/ACT nasal spray Place 1 spray into both nostrils daily. 05/06/17 05/06/18  [provider]  hydrOXYzine (ATARAX/VISTARIL) 25 MG tablet TAKE 1 TABLET (25 MG TOTAL) BY  MOUTH EVERY 8 (EIGHT) HOURS AS NEEDED. 12/11/17   Particia Nearing, PA-C  lithium carbonate 300 MG capsule TAKE 1 CAPSULE (300 MG TOTAL) BY MOUTH 2 (TWO) TIMES DAILY AT 10 AM AND 5 PM. 05/30/17   Particia Nearing, PA-C  ondansetron (ZOFRAN) 4 MG tablet Take 1 tablet (4 mg total) by mouth every 8 (eight) hours as needed for nausea or vomiting. 05/30/17   Particia Nearing, PA-C  PROAIR HFA 108 [phone] Base) MCG/ACT inhaler TAKE 2 PUFFS BY MOUTH EVERY 6 HOURS AS NEEDED FOR WHEEZE 08/07/17   Particia Nearing, PA-C    Allergies Cymbalta [duloxetine hcl] and Vraylar [cariprazine hcl]  Family History  Problem Relation Age of Onset  . Cancer Maternal Grandmother   . Heart disease Maternal Grandmother   . Cancer Maternal Grandfather   . Heart disease Maternal Grandfather   . COPD Neg Hx   . Diabetes Neg Hx   . Hypertension Neg Hx   . Stroke Neg Hx     Social History Social History   Tobacco Use  . Smoking status: Current Every Day Smoker    Packs/day: 0.30    Types: Cigarettes  . Smokeless tobacco: Never Used  Substance Use Topics  . Alcohol use: No  . Drug use: Yes    Types: Marijuana    Review of Systems  Constitutional: No fever. Eyes: No visual changes. ENT: No sore throat. Cardiovascular: Denies chest pain. Respiratory: Denies shortness of breath. Gastrointestinal: No vomiting or diarrhea.  Genitourinary: Positive for dysuria, vaginal bleeding, suprapubic discomfort. Musculoskeletal: Negative  for back pain. Skin: Negative for rash. Neurological: Negative for headache.   ____________________________________________   PHYSICAL EXAM:  VITAL SIGNS: ED Triage Vitals  Enc Vitals Group     BP 02/07/18 1939 129/82     Pulse Rate 02/07/18 1939 78     Resp 02/07/18 1939 16     Temp 02/07/18 1939 98.8 F (37.1 C)     Temp Source 02/07/18 1939 Oral     SpO2 02/07/18 1939 100 %     Weight 02/07/18 1940 130 lb (59 kg)     Height 02/07/18 1940 5\' 6"   (1.676 m)     Head Circumference --      Peak Flow --      Pain Score 02/07/18 1940 2     Pain Loc --      Pain Edu? --      Excl. in GC? --     Constitutional: Alert and oriented. Well appearing and in no acute distress. Eyes: Conjunctivae are normal.  Head: Atraumatic. Nose: No congestion/rhinnorhea. Mouth/Throat: Mucous membranes are moist.   Neck: Normal range of motion.  Cardiovascular:  Good peripheral circulation. Respiratory: Normal respiratory effort Gastrointestinal: Soft and nontender. No distention.  Genitourinary: Normal external genitalia.  No significant vaginal discharge.  Trace amount of blood in the vault.  No CMT or adnexal tenderness. Musculoskeletal: No lower extremity edema.  Extremities warm and well perfused.  Neurologic:  Normal speech and language. No gross focal neurologic deficits are appreciated.  Skin:  Skin is warm and dry. No rash noted. Psychiatric: Mood and affect are normal. Speech and behavior are normal.  ____________________________________________   LABS (all labs ordered are listed, but only abnormal results are displayed)  Labs Reviewed  WET PREP, GENITAL - Abnormal; Notable for the following components:      Result Value   Clue Cells Wet Prep HPF POC PRESENT (*)    WBC, Wet Prep HPF POC RARE (*)    All other components within normal limits  URINE CULTURE  CHLAMYDIA/NGC RT PCR ([facility] ONLY)  URINALYSIS, COMPLETE (UACMP) WITH MICROSCOPIC  POC URINE PREG, ED   ____________________________________________  EKG   ____________________________________________  RADIOLOGY    ____________________________________________   PROCEDURES  Procedure(s) performed: No  Procedures  Critical Care performed: No ____________________________________________   INITIAL IMPRESSION / ASSESSMENT AND PLAN / ED COURSE  Pertinent labs & imaging results that were available during my care of the patient were reviewed by me and  considered in my medical decision making (see chart for details).  22 year old female presents with dysuria and foul odor to her urine over the last 2 weeks, associated with vaginal bleeding and some suprapubic discomfort today.  On exam the patient is well-appearing.  The abdomen is soft and nontender.  There is a small amount of blood in the vault but no active hemorrhage, no significant discharge.  Differential includes primarily recurrent/inadequately treated UTI versus BV or Trichomonas.  There is no evidence of PID, or torsion or other acute gynecologic emergency requiring imaging.  We will obtain UA, GC/CT, and wet prep and reassess. ____________________________________________   FINAL CLINICAL IMPRESSION(S) / ED DIAGNOSES  Final diagnoses:  None      NEW MEDICATIONS STARTED DURING THIS VISIT:  New Prescriptions   No medications on file     Note:  This document was prepared using Dragon voice recognition software and may include unintentional dictation errors.    Dionne Bucy, MD 02/08/18 [phone]

## 2018-02-10 LAB — URINE CULTURE

## 2018-03-22 ENCOUNTER — Other Ambulatory Visit: Payer: Self-pay

## 2018-03-22 ENCOUNTER — Encounter: Payer: Self-pay | Admitting: Emergency Medicine

## 2018-03-22 ENCOUNTER — Emergency Department
Admission: EM | Admit: 2018-03-22 | Discharge: 2018-03-22 | Disposition: A | Discharge status: Home / Self Care | Payer: 59 | Attending: Emergency Medicine | Admitting: Emergency Medicine

## 2018-03-22 DIAGNOSIS — R1032 Left lower quadrant pain: Secondary | ICD-10-CM | POA: Diagnosis present

## 2018-03-22 DIAGNOSIS — N200 Calculus of kidney: Secondary | ICD-10-CM | POA: Diagnosis not present

## 2018-03-22 DIAGNOSIS — F17210 Nicotine dependence, cigarettes, uncomplicated: Secondary | ICD-10-CM | POA: Insufficient documentation

## 2018-03-22 DIAGNOSIS — Z79899 Other long term (current) drug therapy: Secondary | ICD-10-CM | POA: Diagnosis not present

## 2018-03-22 LAB — URINALYSIS, COMPLETE (UACMP) WITH MICROSCOPIC
BILIRUBIN URINE: NEGATIVE
Glucose, UA: NEGATIVE mg/dL
Ketones, ur: NEGATIVE mg/dL
LEUKOCYTES UA: NEGATIVE
Nitrite: NEGATIVE
PH: 5 (ref 5.0–8.0)
Protein, ur: NEGATIVE mg/dL
SPECIFIC GRAVITY, URINE: 1.013 (ref 1.005–1.030)

## 2018-03-22 LAB — BASIC METABOLIC PANEL
ANION GAP: 7 (ref 5–15)
BUN: 11 mg/dL (ref 6–20)
CHLORIDE: 107 mmol/L (ref 98–111)
CO2: 24 mmol/L (ref 22–32)
CREATININE: 1.03 mg/dL — AB (ref 0.44–1.00)
Calcium: 9.2 mg/dL (ref 8.9–10.3)
GFR calc Af Amer: >60 mL/min (ref >60)
GFR calc non Af Amer: >60 mL/min (ref >60)
Glucose, Bld: 106 mg/dL — ABNORMAL HIGH (ref 70–99)
Potassium: 3.7 mmol/L (ref 3.5–5.1)
Sodium: 138 mmol/L (ref 135–145)

## 2018-03-22 LAB — CBC
HEMATOCRIT: 43.1 % (ref 36.0–46.0)
Hemoglobin: 13.7 g/dL (ref 12.0–15.0)
MCH: 27.7 pg (ref 26.0–34.0)
MCHC: 31.8 g/dL (ref 30.0–36.0)
MCV: 87.1 fL (ref 80.0–100.0)
PLATELETS: 350 10*3/uL (ref 150–400)
RBC: 4.95 MIL/uL (ref 3.87–5.11)
RDW: 13.9 % (ref 11.5–15.5)
WBC: 10.1 10*3/uL (ref 4.0–10.5)
nRBC: 0 % (ref 0.0–0.2)

## 2018-03-22 MED ORDER — TAMSULOSIN HCL 0.4 MG PO CAPS: 0.4000 mg | ORAL_CAPSULE | Freq: Every day | ORAL | 0 refills | Status: DC

## 2018-03-22 MED ORDER — ONDANSETRON HCL 4 MG PO TABS: 4.0000 mg | ORAL_TABLET | Freq: Three times a day (TID) | ORAL | 0 refills | Status: DC | PRN

## 2018-03-22 MED ORDER — IBUPROFEN 200 MG PO TABS: 600.0000 mg | ORAL_TABLET | Freq: Four times a day (QID) | ORAL | 0 refills | Status: DC | PRN

## 2018-03-22 MED ORDER — OXYCODONE-ACETAMINOPHEN 5-325 MG PO TABS: 1.0000 | ORAL_TABLET | ORAL | 0 refills | Status: DC | PRN

## 2018-03-22 MED ORDER — SODIUM CHLORIDE 0.9 % IV BOLUS
500.0000 mL | Freq: Once | INTRAVENOUS | Status: AC
  Administered 2018-03-22: 500 mL via INTRAVENOUS

## 2018-03-22 MED ORDER — KETOROLAC TROMETHAMINE 30 MG/ML IJ SOLN
30.0000 mg | Freq: Once | INTRAMUSCULAR | Status: AC
  Administered 2018-03-22: 30 mg via INTRAVENOUS
  Filled 2018-03-22: qty 1

## 2018-03-22 NOTE — ED Triage Notes (Addendum)
Pt to ED c/o LFT flank pain and hematuria since this am, states was told she had stones x1 year ago but were asymptomatic. VSS, denies n/v/d

## 2018-03-22 NOTE — Discharge Instructions (Signed)
We have agreed not to do another CT scan on you at this time, this is most likely kidney stone.  Most likely will pass on his own.  If you have fever, increased pain, vomiting, diarrhea or you feel worse in any way, please return to the emergency department.  Please follow-up with urology as directed.  Do not drink or drive on Percocet.

## 2018-03-22 NOTE — ED Notes (Signed)
Pt ambulatory to toilet independently. 

## 2018-03-22 NOTE — ED Notes (Signed)
Urine POC negative. 
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
## 2018-03-27 ENCOUNTER — Ambulatory Visit: Payer: 59 | Admitting: Family Medicine

## 2018-04-01 ENCOUNTER — Emergency Department
Admission: EM | Admit: 2018-04-01 | Discharge: 2018-04-01 | Disposition: A | Discharge status: Home / Self Care | Payer: 59 | Attending: Emergency Medicine | Admitting: Emergency Medicine

## 2018-04-01 ENCOUNTER — Other Ambulatory Visit: Payer: Self-pay

## 2018-04-01 ENCOUNTER — Emergency Department: Payer: 59

## 2018-04-01 DIAGNOSIS — Y9389 Activity, other specified: Secondary | ICD-10-CM | POA: Insufficient documentation

## 2018-04-01 DIAGNOSIS — R079 Chest pain, unspecified: Secondary | ICD-10-CM | POA: Diagnosis present

## 2018-04-01 DIAGNOSIS — R0789 Other chest pain: Secondary | ICD-10-CM | POA: Diagnosis not present

## 2018-04-01 DIAGNOSIS — Y92410 Unspecified street and highway as the place of occurrence of the external cause: Secondary | ICD-10-CM | POA: Insufficient documentation

## 2018-04-01 DIAGNOSIS — Z79899 Other long term (current) drug therapy: Secondary | ICD-10-CM | POA: Insufficient documentation

## 2018-04-01 DIAGNOSIS — F17210 Nicotine dependence, cigarettes, uncomplicated: Secondary | ICD-10-CM | POA: Diagnosis not present

## 2018-04-01 DIAGNOSIS — Y999 Unspecified external cause status: Secondary | ICD-10-CM | POA: Diagnosis not present

## 2018-04-01 MED ORDER — IBUPROFEN 600 MG PO TABS: 600.0000 mg | ORAL_TABLET | Freq: Three times a day (TID) | ORAL | 0 refills | Status: DC | PRN

## 2018-04-01 MED ORDER — HYDROCODONE-ACETAMINOPHEN 5-325 MG PO TABS
2.0000 | ORAL_TABLET | Freq: Once | ORAL | Status: AC
  Administered 2018-04-01: 2 via ORAL
  Filled 2018-04-01: qty 2

## 2018-04-01 MED ORDER — IBUPROFEN 600 MG PO TABS
600.0000 mg | ORAL_TABLET | Freq: Once | ORAL | Status: AC
  Administered 2018-04-01: 600 mg via ORAL
  Filled 2018-04-01: qty 1

## 2018-04-01 MED ORDER — DIAZEPAM 2 MG PO TABS: 2.0000 mg | ORAL_TABLET | Freq: Three times a day (TID) | ORAL | 0 refills | Status: DC | PRN
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
## 2018-04-01 NOTE — ED Notes (Signed)
Patient ambulatory to stat desk in no acute distress. Patient given a warm blanket and updated on wait time. Patient verbalizes understanding.

## 2018-04-01 NOTE — ED Notes (Signed)
Patient transported to X-ray 

## 2018-04-01 NOTE — ED Notes (Signed)
Spoke with Dr. Lamont Snowballifenbark regarding patient, order received.
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
## 2018-04-05 ENCOUNTER — Other Ambulatory Visit: Payer: Self-pay

## 2018-04-05 ENCOUNTER — Ambulatory Visit (INDEPENDENT_AMBULATORY_CARE_PROVIDER_SITE_OTHER): Payer: 59 | Admitting: Family Medicine

## 2018-04-05 ENCOUNTER — Encounter: Payer: Self-pay | Admitting: Family Medicine

## 2018-04-05 VITALS — BP 113/74 | HR 69 | Temp 98.3°F | Ht 66.0 in | Wt 136.0 lb

## 2018-04-05 DIAGNOSIS — R079 Chest pain, unspecified: Secondary | ICD-10-CM | POA: Diagnosis not present

## 2018-04-05 DIAGNOSIS — L700 Acne vulgaris: Secondary | ICD-10-CM | POA: Diagnosis not present

## 2018-04-05 DIAGNOSIS — S161XXD Strain of muscle, fascia and tendon at neck level, subsequent encounter: Secondary | ICD-10-CM | POA: Diagnosis not present

## 2018-04-05 MED ORDER — CYCLOBENZAPRINE HCL 10 MG PO TABS: 10.0000 mg | ORAL_TABLET | Freq: Every evening | ORAL | 0 refills | Status: DC | PRN

## 2018-04-05 MED ORDER — CLINDAMYCIN PHOS-BENZOYL PEROX 1-5 % EX GEL: Freq: Two times a day (BID) | CUTANEOUS | 2 refills | Status: DC
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
## 2018-05-12 ENCOUNTER — Other Ambulatory Visit: Payer: Self-pay | Admitting: Family Medicine

## 2018-05-14 NOTE — Telephone Encounter (Signed)
Requested medication (s) are due for refill today: Yes  Requested medication (s) are on the active medication list: Yes  Last refill:  04/05/18  Future visit scheduled: Yes  Notes to clinic:  Unable to refill, not delegated.     Requested Prescriptions  Pending Prescriptions Disp Refills   cyclobenzaprine (FLEXERIL) 10 MG tablet [Pharmacy Med Name: CYCLOBENZAPRINE 10 MG TABLET] 30 tablet 0    Sig: TAKE 1 TABLET BY MOUTH AT BEDTIME AS NEEDED FOR MUSCLE SPASMS     Not Delegated - Analgesics:  Muscle Relaxants Failed - 05/12/2018 11:46 AM      Failed - This refill cannot be delegated      Passed - Valid encounter within last 6 months    Recent Outpatient Visits          1 month ago Chest pain, unspecified type   [facility] Particia Nearing, [place]   9 months ago Urinary hesitancy   [facility] [place] M[place], [place]   1 year ago Depression, recurrent [facility])   [facility] Particia Nearing, [place]   1 year ago Depression, major, single episode, severe (HCC)   [facility] Gabriel Cirri, NP   1 year ago Severe episode of recurrent major depressive disorder, without psychotic features [facility])   [facility] Particia Nearing, PA-C      Future Appointments            In 2 days Maurice March, Salley Hews, PA-C [facility]

## 2018-05-16 ENCOUNTER — Encounter: Payer: Self-pay | Admitting: Family Medicine

## 2018-05-16 ENCOUNTER — Ambulatory Visit (INDEPENDENT_AMBULATORY_CARE_PROVIDER_SITE_OTHER): Payer: 59 | Admitting: Family Medicine

## 2018-05-16 VITALS — BP 117/72 | HR 69 | Temp 98.1°F | Ht 66.93 in | Wt 138.0 lb

## 2018-05-16 DIAGNOSIS — K644 Residual hemorrhoidal skin tags: Secondary | ICD-10-CM

## 2018-05-16 DIAGNOSIS — F319 Bipolar disorder, unspecified: Secondary | ICD-10-CM | POA: Insufficient documentation

## 2018-05-16 DIAGNOSIS — Z113 Encounter for screening for infections with a predominantly sexual mode of transmission: Secondary | ICD-10-CM | POA: Diagnosis not present

## 2018-05-16 DIAGNOSIS — Z0000 Encounter for general adult medical examination without abnormal findings: Secondary | ICD-10-CM | POA: Diagnosis not present

## 2018-05-16 DIAGNOSIS — Z5181 Encounter for therapeutic drug level monitoring: Secondary | ICD-10-CM

## 2018-05-16 LAB — UA/M W/RFLX CULTURE, ROUTINE
BILIRUBIN UA: NEGATIVE
GLUCOSE, UA: NEGATIVE
Ketones, UA: NEGATIVE
Leukocytes, UA: NEGATIVE
Nitrite, UA: NEGATIVE
PH UA: 6.5 (ref 5.0–7.5)
Protein, UA: NEGATIVE
RBC, UA: NEGATIVE
Specific Gravity, UA: 1.02 (ref 1.005–1.030)
UUROB: 0.2 mg/dL (ref 0.2–1.0)

## 2018-05-16 MED ORDER — HYDROCORTISONE 2.5 % RE CREA: 1.0000 "application " | TOPICAL_CREAM | Freq: Two times a day (BID) | RECTAL | 1 refills | Status: DC

## 2018-05-16 NOTE — Assessment & Plan Note (Signed)
Stable, followed by Psychiatry. Continue current regimen. Check lithium level today

## 2018-05-16 NOTE — Progress Notes (Signed)
BP 117/72   Pulse 69   Temp 98.1 F (36.7 C) (Oral)   Ht 5' 6.93" (1.7 m)   Wt 138 lb (62.6 kg)   SpO2 94%   BMI 21.66 kg/m    Subjective:    Patient ID: Sue MedinSabrina D Mattioli, female    DOB: [birth year], 23 y.o.   MRN: [id number]  HPI: Sue Jones is a 23 y.o. female presenting on 05/16/2018 for comprehensive medical examination. Current medical complaints include:see below  Having several weeks of pain with/after BMs and feeling like something is protruding from her rectum. Has been recently trying fiber supplements and hemorrhoid cream without relief. No bleeding, discharge, itching, fevers, abdominal pain, diarrhea.   Followed by Psychiatry for bipolar depression, stable currently - no complaints. Needs lithium level drawn.   She currently lives with: Menopausal Symptoms: no  Depression Screen done today and results listed below:  [facility] 04/05/2018 05/08/2017 03/28/2017 02/03/2017 01/06/2017  Decreased Interest 0 0 3 3 3   Down, Depressed, Hopeless 2 0 3 2 2   PHQ - 2 Score 2 0 6 5 5   Altered sleeping 3 0 1 3 3   Tired, decreased energy 1 3 3 3 3   Change in appetite 3 3 3 3 1   Feeling bad or failure about yourself  2 - - 1 3  Trouble concentrating 0 1 1 2  0  Moving slowly or fidgety/restless 3 2 3  0 3  Suicidal thoughts 0 0 3 2 1   PHQ-9 Score 14 9 20 19 19     The patient does not have a history of falls. I did not complete a risk assessment for falls. A plan of care for falls was not documented.   Past Medical History:  Past Medical History:  Diagnosis Date  . Anxiety   . Bipolar 1 disorder (HCC)   . Depression     Surgical History:  History reviewed. No pertinent surgical history.  Medications:  Current Outpatient Medications on File Prior to Visit  Medication Sig  . citalopram (CELEXA) 20 MG tablet TAKE 1 TABLET BY MOUTH EVERY DAY  . clindamycin-benzoyl peroxide (BENZACLIN) gel Apply topically 2 (two) times daily.  . cyclobenzaprine  (FLEXERIL) 10 MG tablet TAKE 1 TABLET BY MOUTH AT BEDTIME AS NEEDED FOR MUSCLE SPASMS  . Etonogestrel (NEXPLANON [place]) Inject into the skin.  . hydrOXYzine (ATARAX/VISTARIL) 25 MG tablet TAKE 1 TABLET (25 MG TOTAL) BY MOUTH EVERY 8 (EIGHT) HOURS AS NEEDED.  Marland Kitchen. lithium carbonate 300 MG capsule TAKE 1 CAPSULE (300 MG TOTAL) BY MOUTH 2 (TWO) TIMES DAILY AT 10 AM AND 5 PM.  . ondansetron (ZOFRAN) 4 MG tablet Take 1 tablet (4 mg total) by mouth every 8 (eight) hours as needed for nausea or vomiting.  Marland Kitchen. PROAIR HFA 108 (90 Base) MCG/ACT inhaler TAKE 2 PUFFS BY MOUTH EVERY 6 HOURS AS NEEDED FOR WHEEZE  . topiramate (TOPAMAX) 25 MG tablet TAKE 1 TABLET BY MOUTH DAILY  . traZODone (DESYREL) 50 MG tablet TAKE ONE TO TWO TABLETS AT BEDTIME  . fluticasone (FLONASE) 50 MCG/ACT nasal spray Place 1 spray into both nostrils daily.   No current facility-administered medications on file prior to visit.     Allergies:  Allergies  Allergen Reactions  . Cymbalta [Duloxetine Hcl]     Suicide attempt  . Vraylar [Cariprazine Hcl] Other (See Comments)    Caused her to feel like "the world wasn't right", had manic episodes frequently    Social History:  Social  History   Socioeconomic History  . Marital status: Single    Spouse name: Not on file  . Number of children: Not on file  . Years of education: Not on file  . Highest education level: Not on file  Occupational History  . Not on file  Social Needs  . Financial resource strain: Not on file  . Food insecurity:    Worry: Not on file    Inability: Not on file  . Transportation needs:    Medical: Not on file    Non-medical: Not on file  Tobacco Use  . Smoking status: Current Every Day Smoker    Packs/day: 0.30    Types: Cigarettes  . Smokeless tobacco: Never Used  Substance and Sexual Activity  . Alcohol use: No  . Drug use: Yes    Types: Marijuana  . Sexual activity: Not on file  Lifestyle  . Physical activity:    Days per week: Not on file      Minutes per session: Not on file  . Stress: Not on file  Relationships  . Social connections:    Talks on phone: Not on file    Gets together: Not on file    Attends religious service: Not on file    Active member of club or organization: Not on file    Attends meetings of clubs or organizations: Not on file    Relationship status: Not on file  . Intimate partner violence:    Fear of current or ex partner: Not on file    Emotionally abused: Not on file    Physically abused: Not on file    Forced sexual activity: Not on file  Other Topics Concern  . Not on file  Social History Narrative  . Not on file   Social History   Tobacco Use  Smoking Status Current Every Day Smoker  . Packs/day: 0.30  . Types: Cigarettes  Smokeless Tobacco Never Used   Social History   Substance and Sexual Activity  Alcohol Use No    Family History:  Family History  Problem Relation Age of Onset  . Cancer Maternal Grandmother   . Heart disease Maternal Grandmother   . Cancer Maternal Grandfather   . Heart disease Maternal Grandfather   . COPD Neg Hx   . Diabetes Neg Hx   . Hypertension Neg Hx   . Stroke Neg Hx     Past medical history, surgical history, medications, allergies, family history and social history reviewed with patient today and changes made to appropriate areas of the chart.   Review of Systems - General ROS: negative Psychological ROS: negative Ophthalmic ROS: negative ENT ROS: negative Allergy and Immunology ROS: negative Hematological and Lymphatic ROS: negative Endocrine ROS: negative Breast ROS: negative for breast lumps Respiratory ROS: no cough, shortness of breath, or wheezing Cardiovascular ROS: no chest pain or dyspnea on exertion Gastrointestinal ROS: no abdominal pain, change in bowel habits, or black or bloody stools Genito-Urinary ROS: no dysuria, trouble voiding, or hematuria Musculoskeletal ROS: negative Neurological ROS: no TIA or stroke  symptoms Dermatological ROS: negative All other ROS negative except what is listed above and in the HPI.      Objective:    BP 117/72   Pulse 69   Temp 98.1 F (36.7 C) (Oral)   Ht 5' 6.93" (1.7 m)   Wt 138 lb (62.6 kg)   SpO2 94%   BMI 21.66 kg/m   Wt Readings from Last 3 Encounters:  05/16/18 138 lb (62.6 kg)  04/05/18 136 lb (61.7 kg)  04/01/18 125 lb (56.7 kg)    Physical Exam Vitals signs and nursing note reviewed.  Constitutional:      General: She is not in acute distress.    Appearance: She is well-developed.  HENT:     Head: Atraumatic.     Right Ear: External ear normal.     Left Ear: External ear normal.     Nose: Nose normal.     Mouth/Throat:     Pharynx: No oropharyngeal exudate.  Eyes:     General: No scleral icterus.    Conjunctiva/sclera: Conjunctivae normal.     Pupils: Pupils are equal, round, and reactive to light.  Neck:     Musculoskeletal: Normal range of motion and neck supple.     Thyroid: No thyromegaly.  Cardiovascular:     Rate and Rhythm: Normal rate and regular rhythm.     Heart sounds: Normal heart sounds.  Pulmonary:     Effort: Pulmonary effort is normal. No respiratory distress.     Breath sounds: Normal breath sounds.  Abdominal:     General: Bowel sounds are normal.     Palpations: Abdomen is soft. There is no mass.     Tenderness: There is no abdominal tenderness.  Genitourinary:   Musculoskeletal: Normal range of motion.        General: No tenderness.  Lymphadenopathy:     Cervical: No cervical adenopathy.  Skin:    General: Skin is warm and dry.     Findings: No rash.  Neurological:     Mental Status: She is alert and oriented to person, place, and time.     Cranial Nerves: No cranial nerve deficit.  Psychiatric:        Behavior: Behavior normal.     Results for orders placed or performed during the hospital encounter of 03/22/18  Urinalysis, Complete w Microscopic  Result Value Ref Range   Color, Urine  YELLOW (A) YELLOW   APPearance CLOUDY (A) CLEAR   Specific Gravity, Urine 1.013 1.005 - 1.030   pH 5.0 5.0 - 8.0   Glucose, UA NEGATIVE NEGATIVE mg/dL   Hgb urine dipstick LARGE (A) NEGATIVE   Bilirubin Urine NEGATIVE NEGATIVE   Ketones, ur NEGATIVE NEGATIVE mg/dL   Protein, ur NEGATIVE NEGATIVE mg/dL   Nitrite NEGATIVE NEGATIVE   Leukocytes, UA NEGATIVE NEGATIVE   RBC / HPF 11-20 0 - 5 RBC/hpf   WBC, UA 6-10 0 - 5 WBC/hpf   Bacteria, UA RARE (A) NONE SEEN   Squamous Epithelial / LPF 0-5 0 - 5   Mucus PRESENT   Basic metabolic panel  Result Value Ref Range   Sodium 138 135 - 145 mmol/L   Potassium 3.7 3.5 - 5.1 mmol/L   Chloride 107 98 - 111 mmol/L   CO2 24 22 - 32 mmol/L   Glucose, Bld 106 (H) 70 - 99 mg/dL   BUN 11 6 - 20 mg/dL   Creatinine, Ser 1.911.03 (H) 0.44 - 1.00 mg/dL   Calcium 9.2 8.9 - 47.810.3 mg/dL   GFR calc non Af Amer >60 >60 mL/min   GFR calc Af Amer >60 >60 mL/min   Anion gap 7 5 - 15  CBC  Result Value Ref Range   WBC 10.1 4.0 - 10.5 K/uL   RBC 4.95 3.87 - 5.11 MIL/uL   Hemoglobin 13.7 12.0 - 15.0 g/dL   HCT 29.543.1 62.136.0 - 30.846.0 %  MCV 87.1 80.0 - 100.0 fL   MCH 27.7 26.0 - 34.0 pg   MCHC 31.8 30.0 - 36.0 g/dL   RDW 73.4 19.3 - 79.0 %   Platelets 350 150 - 400 K/uL   nRBC 0.0 0.0 - 0.2 %      Assessment & Plan:   Problem List Items Addressed This Visit      Other   Bipolar 1 disorder (HCC)    Stable, followed by Psychiatry. Continue current regimen. Check lithium level today       Other Visit Diagnoses    External hemorrhoid    -  Primary   Will tx with anusol, miralax, increased water intake. Supportive care and return precautions reviewed   Annual physical exam       Relevant Orders   CBC with Differential/Platelet   Comprehensive metabolic panel   Lipid Panel w/o Chol/HDL Ratio   TSH   UA/M w/rflx Culture, Routine   Routine screening for STI (sexually transmitted infection)       Relevant Orders   HIV Antibody (routine testing w rflx)    RPR   HSV(herpes simplex vrs) 1+2 ab-IgG   GC/Chlamydia Probe Amp   Medication monitoring encounter       Relevant Orders   Lithium level       Follow up plan: Return in about 3 months (around 08/15/2018) for pap.   LABORATORY TESTING:  - Pap smear: abnormal pap in April, will return at 1 year mark to recheck  IMMUNIZATIONS:   - Tdap: Tetanus vaccination status reviewed: last tetanus booster within 10 years. - Influenza: Up to date  PATIENT COUNSELING:   Advised to take 1 mg of folate supplement per day if capable of pregnancy.   Sexuality: Discussed sexually transmitted diseases, partner selection, use of condoms, avoidance of unintended pregnancy  and contraceptive alternatives.   Advised to avoid cigarette smoking.  I discussed with the patient that most people either abstain from alcohol or drink within safe limits (<=14/week and <=4 drinks/occasion for males, <=7/weeks and <= 3 drinks/occasion for females) and that the risk for alcohol disorders and other health effects rises proportionally with the number of drinks per week and how often a drinker exceeds daily limits.  Discussed cessation/primary prevention of drug use and availability of treatment for abuse.   Diet: Encouraged to adjust caloric intake to maintain  or achieve ideal body weight, to reduce intake of dietary saturated fat and total fat, to limit sodium intake by avoiding high sodium foods and not adding table salt, and to maintain adequate dietary potassium and calcium preferably from fresh fruits, vegetables, and low-fat dairy products.    stressed the importance of regular exercise  Injury prevention: Discussed safety belts, safety helmets, smoke detector, smoking near bedding or upholstery.   Dental health: Discussed importance of regular tooth brushing, flossing, and dental visits.    NEXT PREVENTATIVE PHYSICAL DUE IN 1 YEAR. Return in about 3 months (around 08/15/2018) for pap.

## 2018-05-17 LAB — CBC WITH DIFFERENTIAL/PLATELET
BASOS: 1 %
Basophils Absolute: 0.1 10*3/uL (ref 0.0–0.2)
EOS (ABSOLUTE): 0.1 10*3/uL (ref 0.0–0.4)
EOS: 1 %
HEMATOCRIT: 41.8 % (ref 34.0–46.6)
Hemoglobin: 13.3 g/dL (ref 11.1–15.9)
IMMATURE GRANULOCYTES: 0 %
Immature Grans (Abs): 0 10*3/uL (ref 0.0–0.1)
LYMPHS ABS: 2.2 10*3/uL (ref 0.7–3.1)
Lymphs: 28 %
MCH: 27.4 pg (ref 26.6–33.0)
MCHC: 31.8 g/dL (ref 31.5–35.7)
MCV: 86 fL (ref 79–97)
MONOS ABS: 0.5 10*3/uL (ref 0.1–0.9)
Monocytes: 7 %
NEUTROS PCT: 63 %
Neutrophils Absolute: 4.9 10*3/uL (ref 1.4–7.0)
PLATELETS: 294 10*3/uL (ref 150–450)
RBC: 4.85 x10E6/uL (ref 3.77–5.28)
RDW: 14.2 % (ref 11.7–15.4)
WBC: 7.8 10*3/uL (ref 3.4–10.8)

## 2018-05-17 LAB — COMPREHENSIVE METABOLIC PANEL
A/G RATIO: 2 (ref 1.2–2.2)
ALK PHOS: 91 IU/L (ref 39–117)
ALT: 10 IU/L (ref 0–32)
AST: 14 IU/L (ref 0–40)
Albumin: 4.3 g/dL (ref 3.9–5.0)
BILIRUBIN TOTAL: 0.4 mg/dL (ref 0.0–1.2)
BUN/Creatinine Ratio: 11 (ref 9–23)
BUN: 10 mg/dL (ref 6–20)
CHLORIDE: 107 mmol/L — AB (ref 96–106)
CO2: 18 mmol/L — ABNORMAL LOW (ref 20–29)
Calcium: 9.3 mg/dL (ref 8.7–10.2)
Creatinine, Ser: 0.9 mg/dL (ref 0.57–1.00)
GFR calc Af Amer: 106 mL/min/{1.73_m2} (ref >59)
GFR, EST NON AFRICAN AMERICAN: 92 mL/min/{1.73_m2} (ref >59)
GLOBULIN, TOTAL: 2.2 g/dL (ref 1.5–4.5)
Glucose: 79 mg/dL (ref 65–99)
POTASSIUM: 4 mmol/L (ref 3.5–5.2)
SODIUM: 139 mmol/L (ref 134–144)
Total Protein: 6.5 g/dL (ref 6.0–8.5)

## 2018-05-17 LAB — HSV(HERPES SIMPLEX VRS) I + II AB-IGG

## 2018-05-17 LAB — LIPID PANEL W/O CHOL/HDL RATIO
CHOLESTEROL TOTAL: 141 mg/dL (ref 100–199)
HDL: 65 mg/dL (ref >39)
LDL Calculated: 62 mg/dL (ref 0–99)
TRIGLYCERIDES: 71 mg/dL (ref 0–149)
VLDL Cholesterol Cal: 14 mg/dL (ref 5–40)

## 2018-05-17 LAB — TSH: TSH: 1.4 u[IU]/mL (ref 0.450–4.500)

## 2018-05-17 LAB — HIV ANTIBODY (ROUTINE TESTING W REFLEX): HIV Screen 4th Generation wRfx: NONREACTIVE

## 2018-05-17 LAB — LITHIUM LEVEL: LITHIUM LVL: 0.4 mmol/L — AB (ref 0.6–1.2)

## 2018-05-17 LAB — RPR: RPR Ser Ql: NONREACTIVE

## 2018-05-18 LAB — GC/CHLAMYDIA PROBE AMP
CHLAMYDIA, DNA PROBE: NEGATIVE
NEISSERIA GONORRHOEAE BY PCR: NEGATIVE

## 2018-08-20 ENCOUNTER — Ambulatory Visit: Payer: 59 | Admitting: Family Medicine

## 2018-10-06 ENCOUNTER — Other Ambulatory Visit: Payer: Self-pay | Admitting: Family Medicine

## 2018-10-08 NOTE — Telephone Encounter (Signed)
Did Apolonio Schneiders was to continue this rx?

## 2019-02-12 ENCOUNTER — Other Ambulatory Visit: Payer: Self-pay | Admitting: Family Medicine

## 2019-03-27 DIAGNOSIS — F411 Generalized anxiety disorder: Secondary | ICD-10-CM | POA: Diagnosis not present

## 2019-04-02 DIAGNOSIS — F250 Schizoaffective disorder, bipolar type: Secondary | ICD-10-CM | POA: Diagnosis not present

## 2019-04-02 DIAGNOSIS — F4312 Post-traumatic stress disorder, chronic: Secondary | ICD-10-CM | POA: Diagnosis not present

## 2019-04-02 DIAGNOSIS — F411 Generalized anxiety disorder: Secondary | ICD-10-CM | POA: Diagnosis not present

## 2019-04-23 DIAGNOSIS — F250 Schizoaffective disorder, bipolar type: Secondary | ICD-10-CM | POA: Diagnosis not present

## 2019-04-23 DIAGNOSIS — F411 Generalized anxiety disorder: Secondary | ICD-10-CM | POA: Diagnosis not present

## 2019-06-14 DIAGNOSIS — F250 Schizoaffective disorder, bipolar type: Secondary | ICD-10-CM | POA: Diagnosis not present

## 2019-06-14 DIAGNOSIS — F4312 Post-traumatic stress disorder, chronic: Secondary | ICD-10-CM | POA: Diagnosis not present

## 2019-06-14 DIAGNOSIS — F411 Generalized anxiety disorder: Secondary | ICD-10-CM | POA: Diagnosis not present
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
## 2019-11-23 ENCOUNTER — Other Ambulatory Visit: Payer: Self-pay | Admitting: Physician Assistant

## 2019-12-07 DIAGNOSIS — F250 Schizoaffective disorder, bipolar type: Secondary | ICD-10-CM | POA: Diagnosis not present

## 2019-12-07 DIAGNOSIS — F411 Generalized anxiety disorder: Secondary | ICD-10-CM | POA: Diagnosis not present

## 2019-12-07 DIAGNOSIS — F4312 Post-traumatic stress disorder, chronic: Secondary | ICD-10-CM | POA: Diagnosis not present

## 2020-01-25 DIAGNOSIS — F603 Borderline personality disorder: Secondary | ICD-10-CM | POA: Diagnosis not present

## 2020-01-25 DIAGNOSIS — R7989 Other specified abnormal findings of blood chemistry: Secondary | ICD-10-CM | POA: Diagnosis not present

## 2020-01-25 DIAGNOSIS — G479 Sleep disorder, unspecified: Secondary | ICD-10-CM | POA: Diagnosis not present

## 2020-01-25 DIAGNOSIS — G4700 Insomnia, unspecified: Secondary | ICD-10-CM | POA: Diagnosis not present

## 2020-01-25 DIAGNOSIS — R630 Anorexia: Secondary | ICD-10-CM | POA: Diagnosis not present

## 2020-01-25 DIAGNOSIS — F39 Unspecified mood [affective] disorder: Secondary | ICD-10-CM | POA: Diagnosis not present

## 2020-01-25 DIAGNOSIS — F321 Major depressive disorder, single episode, moderate: Secondary | ICD-10-CM | POA: Diagnosis not present

## 2020-01-25 DIAGNOSIS — F419 Anxiety disorder, unspecified: Secondary | ICD-10-CM | POA: Diagnosis not present

## 2020-01-25 DIAGNOSIS — R112 Nausea with vomiting, unspecified: Secondary | ICD-10-CM | POA: Diagnosis not present

## 2020-01-25 DIAGNOSIS — R197 Diarrhea, unspecified: Secondary | ICD-10-CM | POA: Diagnosis not present

## 2020-01-25 DIAGNOSIS — R451 Restlessness and agitation: Secondary | ICD-10-CM | POA: Diagnosis not present

## 2020-01-25 DIAGNOSIS — F319 Bipolar disorder, unspecified: Secondary | ICD-10-CM | POA: Diagnosis not present

## 2020-01-25 DIAGNOSIS — R45851 Suicidal ideations: Secondary | ICD-10-CM | POA: Diagnosis not present

## 2020-01-25 DIAGNOSIS — Z20822 Contact with and (suspected) exposure to covid-19: Secondary | ICD-10-CM | POA: Diagnosis not present

## 2020-01-26 DIAGNOSIS — F321 Major depressive disorder, single episode, moderate: Secondary | ICD-10-CM | POA: Diagnosis not present

## 2020-01-26 DIAGNOSIS — F419 Anxiety disorder, unspecified: Secondary | ICD-10-CM | POA: Diagnosis not present

## 2020-01-26 DIAGNOSIS — G479 Sleep disorder, unspecified: Secondary | ICD-10-CM | POA: Diagnosis not present

## 2020-01-26 DIAGNOSIS — R630 Anorexia: Secondary | ICD-10-CM | POA: Diagnosis not present

## 2020-01-26 DIAGNOSIS — F319 Bipolar disorder, unspecified: Secondary | ICD-10-CM | POA: Diagnosis not present

## 2020-01-26 DIAGNOSIS — R451 Restlessness and agitation: Secondary | ICD-10-CM | POA: Diagnosis not present

## 2020-01-26 DIAGNOSIS — R45851 Suicidal ideations: Secondary | ICD-10-CM | POA: Diagnosis not present

## 2020-01-26 DIAGNOSIS — Z20822 Contact with and (suspected) exposure to covid-19: Secondary | ICD-10-CM | POA: Diagnosis not present

## 2020-01-26 DIAGNOSIS — F39 Unspecified mood [affective] disorder: Secondary | ICD-10-CM | POA: Diagnosis not present

## 2020-01-26 DIAGNOSIS — R7989 Other specified abnormal findings of blood chemistry: Secondary | ICD-10-CM | POA: Diagnosis not present

## 2020-01-26 DIAGNOSIS — F603 Borderline personality disorder: Secondary | ICD-10-CM | POA: Diagnosis not present

## 2020-01-27 DIAGNOSIS — R7989 Other specified abnormal findings of blood chemistry: Secondary | ICD-10-CM | POA: Diagnosis not present

## 2020-01-27 DIAGNOSIS — R451 Restlessness and agitation: Secondary | ICD-10-CM | POA: Diagnosis not present

## 2020-01-27 DIAGNOSIS — F419 Anxiety disorder, unspecified: Secondary | ICD-10-CM | POA: Diagnosis not present

## 2020-01-27 DIAGNOSIS — F319 Bipolar disorder, unspecified: Secondary | ICD-10-CM | POA: Diagnosis not present

## 2020-01-27 DIAGNOSIS — Z20822 Contact with and (suspected) exposure to covid-19: Secondary | ICD-10-CM | POA: Diagnosis not present

## 2020-01-27 DIAGNOSIS — F39 Unspecified mood [affective] disorder: Secondary | ICD-10-CM | POA: Diagnosis not present

## 2020-01-27 DIAGNOSIS — R45851 Suicidal ideations: Secondary | ICD-10-CM | POA: Diagnosis not present

## 2020-01-27 DIAGNOSIS — F603 Borderline personality disorder: Secondary | ICD-10-CM | POA: Diagnosis not present

## 2020-01-27 DIAGNOSIS — R630 Anorexia: Secondary | ICD-10-CM | POA: Diagnosis not present

## 2020-01-28 DIAGNOSIS — R45850 Homicidal ideations: Secondary | ICD-10-CM | POA: Diagnosis not present

## 2020-01-28 DIAGNOSIS — R45851 Suicidal ideations: Secondary | ICD-10-CM | POA: Diagnosis not present

## 2020-01-28 DIAGNOSIS — F39 Unspecified mood [affective] disorder: Secondary | ICD-10-CM | POA: Diagnosis not present

## 2020-01-28 DIAGNOSIS — F319 Bipolar disorder, unspecified: Secondary | ICD-10-CM | POA: Diagnosis not present

## 2020-01-28 DIAGNOSIS — F4310 Post-traumatic stress disorder, unspecified: Secondary | ICD-10-CM | POA: Diagnosis not present

## 2020-01-28 DIAGNOSIS — F603 Borderline personality disorder: Secondary | ICD-10-CM | POA: Diagnosis not present

## 2020-01-28 DIAGNOSIS — Z20822 Contact with and (suspected) exposure to covid-19: Secondary | ICD-10-CM | POA: Diagnosis not present

## 2020-01-28 DIAGNOSIS — Z720 Tobacco use: Secondary | ICD-10-CM | POA: Diagnosis not present

## 2020-01-28 DIAGNOSIS — R630 Anorexia: Secondary | ICD-10-CM | POA: Diagnosis not present

## 2020-01-28 DIAGNOSIS — F17200 Nicotine dependence, unspecified, uncomplicated: Secondary | ICD-10-CM | POA: Diagnosis not present

## 2020-01-28 DIAGNOSIS — F332 Major depressive disorder, recurrent severe without psychotic features: Secondary | ICD-10-CM | POA: Diagnosis not present

## 2020-01-28 DIAGNOSIS — Z62810 Personal history of physical and sexual abuse in childhood: Secondary | ICD-10-CM | POA: Diagnosis not present

## 2020-01-28 DIAGNOSIS — F419 Anxiety disorder, unspecified: Secondary | ICD-10-CM | POA: Diagnosis not present

## 2020-01-28 DIAGNOSIS — R7989 Other specified abnormal findings of blood chemistry: Secondary | ICD-10-CM | POA: Diagnosis not present

## 2020-01-28 DIAGNOSIS — F411 Generalized anxiety disorder: Secondary | ICD-10-CM | POA: Diagnosis not present

## 2020-01-28 DIAGNOSIS — F1020 Alcohol dependence, uncomplicated: Secondary | ICD-10-CM | POA: Diagnosis not present

## 2020-01-28 DIAGNOSIS — R451 Restlessness and agitation: Secondary | ICD-10-CM | POA: Diagnosis not present

## 2020-01-28 DIAGNOSIS — Z62811 Personal history of psychological abuse in childhood: Secondary | ICD-10-CM | POA: Diagnosis not present

## 2020-01-28 DIAGNOSIS — F1320 Sedative, hypnotic or anxiolytic dependence, uncomplicated: Secondary | ICD-10-CM | POA: Diagnosis not present

## 2020-02-04 ENCOUNTER — Other Ambulatory Visit: Payer: Self-pay

## 2020-02-08 ENCOUNTER — Other Ambulatory Visit: Payer: Self-pay | Admitting: Physician Assistant

## 2020-02-08 DIAGNOSIS — F411 Generalized anxiety disorder: Secondary | ICD-10-CM | POA: Diagnosis not present

## 2020-02-08 DIAGNOSIS — F4312 Post-traumatic stress disorder, chronic: Secondary | ICD-10-CM | POA: Diagnosis not present

## 2020-02-08 DIAGNOSIS — F250 Schizoaffective disorder, bipolar type: Secondary | ICD-10-CM | POA: Diagnosis not present

## 2020-03-10 DIAGNOSIS — F430 Acute stress reaction: Secondary | ICD-10-CM | POA: Diagnosis not present

## 2020-03-17 DIAGNOSIS — F430 Acute stress reaction: Secondary | ICD-10-CM | POA: Diagnosis not present

## 2020-03-31 DIAGNOSIS — F430 Acute stress reaction: Secondary | ICD-10-CM | POA: Diagnosis not present

## 2020-04-07 DIAGNOSIS — F430 Acute stress reaction: Secondary | ICD-10-CM | POA: Diagnosis not present

## 2020-04-21 DIAGNOSIS — F430 Acute stress reaction: Secondary | ICD-10-CM | POA: Diagnosis not present

## 2020-04-28 DIAGNOSIS — F430 Acute stress reaction: Secondary | ICD-10-CM | POA: Diagnosis not present

## 2020-05-05 DIAGNOSIS — F430 Acute stress reaction: Secondary | ICD-10-CM | POA: Diagnosis not present

## 2020-05-19 DIAGNOSIS — F430 Acute stress reaction: Secondary | ICD-10-CM | POA: Diagnosis not present

## 2020-05-26 DIAGNOSIS — F430 Acute stress reaction: Secondary | ICD-10-CM | POA: Diagnosis not present

## 2020-06-02 DIAGNOSIS — F430 Acute stress reaction: Secondary | ICD-10-CM | POA: Diagnosis not present

## 2020-06-06 ENCOUNTER — Other Ambulatory Visit: Payer: Self-pay | Admitting: Physician Assistant

## 2020-06-06 DIAGNOSIS — F4312 Post-traumatic stress disorder, chronic: Secondary | ICD-10-CM | POA: Diagnosis not present

## 2020-06-06 DIAGNOSIS — F411 Generalized anxiety disorder: Secondary | ICD-10-CM | POA: Diagnosis not present

## 2020-06-06 DIAGNOSIS — F250 Schizoaffective disorder, bipolar type: Secondary | ICD-10-CM | POA: Diagnosis not present

## 2020-06-09 DIAGNOSIS — F430 Acute stress reaction: Secondary | ICD-10-CM | POA: Diagnosis not present

## 2020-06-23 DIAGNOSIS — F430 Acute stress reaction: Secondary | ICD-10-CM | POA: Diagnosis not present

## 2020-06-30 DIAGNOSIS — F430 Acute stress reaction: Secondary | ICD-10-CM | POA: Diagnosis not present

## 2020-07-07 DIAGNOSIS — F430 Acute stress reaction: Secondary | ICD-10-CM | POA: Diagnosis not present

## 2020-07-21 DIAGNOSIS — F430 Acute stress reaction: Secondary | ICD-10-CM | POA: Diagnosis not present

## 2020-07-25 ENCOUNTER — Encounter: Payer: Self-pay | Admitting: *Deleted

## 2020-07-25 ENCOUNTER — Other Ambulatory Visit: Payer: Self-pay

## 2020-07-25 ENCOUNTER — Emergency Department
Admission: EM | Admit: 2020-07-25 | Discharge: 2020-07-26 | Disposition: A | Discharge status: Home / Self Care | Payer: 59 | Attending: Emergency Medicine | Admitting: Emergency Medicine

## 2020-07-25 DIAGNOSIS — R079 Chest pain, unspecified: Secondary | ICD-10-CM | POA: Diagnosis not present

## 2020-07-25 DIAGNOSIS — F17210 Nicotine dependence, cigarettes, uncomplicated: Secondary | ICD-10-CM | POA: Insufficient documentation

## 2020-07-25 DIAGNOSIS — R072 Precordial pain: Secondary | ICD-10-CM | POA: Diagnosis not present

## 2020-07-25 DIAGNOSIS — T7421XA Adult sexual abuse, confirmed, initial encounter: Secondary | ICD-10-CM | POA: Insufficient documentation

## 2020-07-25 DIAGNOSIS — R0781 Pleurodynia: Secondary | ICD-10-CM | POA: Diagnosis not present
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
## 2020-07-26 ENCOUNTER — Emergency Department: Payer: 59

## 2020-07-26 ENCOUNTER — Other Ambulatory Visit (HOSPITAL_COMMUNITY): Payer: Self-pay

## 2020-07-26 ENCOUNTER — Other Ambulatory Visit: Payer: Self-pay

## 2020-07-26 DIAGNOSIS — R072 Precordial pain: Secondary | ICD-10-CM | POA: Diagnosis not present

## 2020-07-26 DIAGNOSIS — T7421XA Adult sexual abuse, confirmed, initial encounter: Secondary | ICD-10-CM | POA: Diagnosis not present

## 2020-07-26 DIAGNOSIS — R079 Chest pain, unspecified: Secondary | ICD-10-CM | POA: Diagnosis not present

## 2020-07-26 DIAGNOSIS — F17210 Nicotine dependence, cigarettes, uncomplicated: Secondary | ICD-10-CM | POA: Diagnosis not present

## 2020-07-26 DIAGNOSIS — R0781 Pleurodynia: Secondary | ICD-10-CM | POA: Diagnosis not present

## 2020-07-26 LAB — RAPID HIV SCREEN (HIV 1/2 AB+AG)
HIV 1/2 Antibodies: NONREACTIVE
HIV-1 P24 Antigen - HIV24: NONREACTIVE

## 2020-07-26 LAB — COMPREHENSIVE METABOLIC PANEL
ALT: 26 U/L (ref 0–44)
AST: 27 U/L (ref 15–41)
Albumin: 4.1 g/dL (ref 3.5–5.0)
Alkaline Phosphatase: 89 U/L (ref 38–126)
Anion gap: 8 (ref 5–15)
BUN: 11 mg/dL (ref 6–20)
CO2: 23 mmol/L (ref 22–32)
Calcium: 8.5 mg/dL — ABNORMAL LOW (ref 8.9–10.3)
Chloride: 108 mmol/L (ref 98–111)
Creatinine, Ser: 0.64 mg/dL (ref 0.44–1.00)
GFR, Estimated: >60 mL/min (ref >60)
Glucose, Bld: 105 mg/dL — ABNORMAL HIGH (ref 70–99)
Potassium: 3.8 mmol/L (ref 3.5–5.1)
Sodium: 139 mmol/L (ref 135–145)
Total Bilirubin: 0.4 mg/dL (ref 0.3–1.2)
Total Protein: 7.2 g/dL (ref 6.5–8.1)

## 2020-07-26 LAB — WET PREP, GENITAL
Sperm: NONE SEEN
Trich, Wet Prep: NONE SEEN
WBC, Wet Prep HPF POC: NONE SEEN
Yeast Wet Prep HPF POC: NONE SEEN

## 2020-07-26 LAB — CHLAMYDIA/NGC RT PCR (ARMC ONLY)
Chlamydia Tr: NOT DETECTED
N gonorrhoeae: NOT DETECTED

## 2020-07-26 LAB — HEPATITIS B SURFACE ANTIGEN: Hepatitis B Surface Ag: NONREACTIVE

## 2020-07-26 LAB — POC URINE PREG, ED: Preg Test, Ur: NEGATIVE

## 2020-07-26 LAB — HEPATITIS C ANTIBODY: HCV Ab: NONREACTIVE

## 2020-07-26 MED ORDER — ELVITEG-COBIC-EMTRICIT-TENOFAF 150-150-200-10 MG PREPACK
1.0000 | ORAL_TABLET | Freq: Once | ORAL | Status: AC
  Administered 2020-07-26: 1 via ORAL
  Filled 2020-07-26: qty 1

## 2020-07-26 MED ORDER — ELVITEG-COBIC-EMTRICIT-TENOFAF 150-150-200-10 MG PO TABS: 1.0000 | ORAL_TABLET | Freq: Every day | ORAL | 0 refills | Status: DC

## 2020-07-26 MED ORDER — ELVITEG-COBIC-EMTRICIT-TENOFAF 150-150-200-10 MG PO TABS
1.0000 | ORAL_TABLET | Freq: Every day | ORAL | 0 refills | Status: DC
  Filled 2020-07-27: qty 30, 30d supply, fill #0

## 2020-07-26 MED ORDER — AZITHROMYCIN 500 MG PO TABS
1000.0000 mg | ORAL_TABLET | Freq: Once | ORAL | Status: AC
  Administered 2020-07-26: 1000 mg via ORAL
  Filled 2020-07-26: qty 2

## 2020-07-26 MED ORDER — LIDOCAINE HCL (PF) 1 % IJ SOLN
1.0000 mL | Freq: Once | INTRAMUSCULAR | Status: AC
  Administered 2020-07-26: 1 mL
  Filled 2020-07-26: qty 5

## 2020-07-26 MED ORDER — METRONIDAZOLE 500 MG PO TABS
2000.0000 mg | ORAL_TABLET | Freq: Once | ORAL | Status: AC
  Administered 2020-07-26: 2000 mg via ORAL
  Filled 2020-07-26: qty 4

## 2020-07-26 MED ORDER — CEFTRIAXONE SODIUM 1 G IJ SOLR
500.0000 mg | Freq: Once | INTRAMUSCULAR | Status: AC
  Administered 2020-07-26: 500 mg via INTRAMUSCULAR
  Filled 2020-07-26: qty 10

## 2020-07-26 NOTE — SANE Note (Signed)
POC preg test NEGATIVE  Catalog number: [id number] GTIN: [id number] Lot: [id number] EXP 2021-01-15

## 2020-07-26 NOTE — SANE Note (Signed)
Flagyl (2000 mg)  and Genvoya (4 tabs) given to patient for home use.

## 2020-07-26 NOTE — SANE Note (Signed)
HIV nPep coupon voucher, prescription, and iAssist consent were faxed to [facility] pharmacy

## 2020-07-26 NOTE — SANE Note (Signed)
The SANE/FNE (Forensic Nurse Examiner) consult has been completed. The primary RN and/or provider have been notified. Please contact the SANE/FNE nurse on call (listed in Amion) with any further concerns.  

## 2020-07-26 NOTE — Discharge Instructions (Signed)
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 [place] What should I tell my health care provider before I take this medicine? They need to know if you have any of these conditions:  kidney disease  liver disease  an unusual or allergic reaction to elvitegravir, cobicistat, emtricitabine, tenofovir, other medicines, foods, dyes, or preservatives  pregnant or trying to get pregnant  breast-feeding How should I use this medicine? Take this medicine by mouth with a glass of water. Follow the directions on the prescription label. Take this medicine with food. Take your medicine at regular intervals. Do not take your medicine more often than directed. For your anti-HIV therapy to work as well as possible, take each dose exactly as prescribed. Do not skip doses or stop your medicine even if you  feel better. Skipping doses may make the HIV virus resistant to this medicine and other medicines. Do not stop taking except on your doctor's advice. Talk to your pediatrician regarding the use of this medicine in children. While this drug may be prescribed for selected conditions, precautions do apply. Overdosage: If you think you have taken too much of this medicine contact a poison control center or emergency room at once. NOTE: This medicine is only for you. Do not share this medicine with others. What if I miss a dose? If you miss a dose, take it as soon as you can. If it is almost time for your next dose, take only that dose. Do not take double or extra doses. What may interact with this medicine? Do not take this medicine with any of the following medications:  adefovir  alfuzosin  certain medicines for seizures like carbamazepine, phenobarbital, phenytoin  cisapride  lumacaftor; ivacaftor  lurasidone  medicines for cholesterol like lovastatin, simvastatin  medicines for headaches like dihydroergotamine, ergotamine, methylergonovine  midazolam  naloxegol  other antiviral medicines for HIV or AIDS  pimozide  rifampin  sildenafil  St. John's wort  triazolam This medicine may also interact with the following medications:  antacids  atorvastatin  bosentan  buprenorphine; naloxone  certain antibiotics like clarithromycin, telithromycin, rifabutin, rifapentine  certain medications for anxiety or sleep like buspirone, clorazepate, diazepam, estazolam, flurazepam, zolpidem  certain medicines for blood pressure or heart disease like amlodipine, diltiazem, felodipine, metoprolol, nicardipine, nifedipine, timolol, verapamil  certain medicines for depression, anxiety, or psychiatric disturbances  certain medicines for erectile dysfunction like avanafil, sildenafil, tadalafil, vardenafil  certain medicines for fungal infection like itraconazole, ketoconazole,  voriconazole  certain medicines that treat or prevent blood clots like warfarin, apixaban, betrixaban, dabigatran, edoxaban, and rivaroxaban  colchicine  cyclosporine  female hormones, like estrogens and progestins and birth control pills  medicines for infection like acyclovir, cidofovir, valacyclovir, ganciclovir, valganciclovir  medicines for irregular heart beat like amiodarone, bepridil, digoxin, disopyramide, dofetilide, flecainide, lidocaine, mexiletine, propafenone, quinidine  metformin  oxcarbazepine  phenothiazines like perphenazine, risperidone, thioridazine  salmeterol  sirolimus  steroid medicines like betamethasone, budesonide, ciclesonide, dexamethasone, fluticasone, methylprednisolone, mometasone, triamcinolone  tacrolimus This list may not describe all possible interactions. Give your health care provider a list of all the medicines, herbs, non-prescription drugs, or dietary supplements you use. Also tell them if you smoke, drink alcohol, or use illegal drugs. Some items may interact with your medicine. What should I watch for while using this medicine? Visit your doctor or health care professional for regular check ups. Discuss any new symptoms with your doctor. You will need  to have important blood work done while on this medicine. HIV is spread to others through sexual or blood contact. Talk to your doctor about how to stop the spread of HIV. If you have hepatitis B, talk to your doctor if you plan to stop this medicine. The symptoms of hepatitis B may get worse if you stop this medicine. Birth control pills may not work properly while you are taking this medicine. Talk to your doctor about using an extra method of birth control. Women who can still have children must use a reliable form of barrier contraception, like a condom. What side effects may I notice from receiving this medicine? Side effects that you should report to your doctor or health care  professional as soon as possible:  allergic reactions like skin rash, itching or hives, swelling of the face, lips, or tongue  breathing problems  fast, irregular heartbeat  muscle pain or weakness  signs and symptoms of kidney injury like trouble passing urine or change in the amount of urine  signs and symptoms of liver injury like dark yellow or brown urine; general ill feeling or flu-like symptoms; light-colored stools; loss of appetite; right upper belly pain; unusually weak or tired; yellowing of the eyes or skin Side effects that usually do not require medical attention (report to your doctor or health care professional if they continue or are bothersome):  diarrhea  headache  nausea  tiredness This list may not describe all possible side effects. Call your doctor for medical advice about side effects. You may report side effects to FDA at 1-800-FDA-1088. Where should I keep my medicine? Keep out of the reach of children. Store at room temperature below 30 degrees C (86 degrees F). Throw away any unused medicine after the expiration date. NOTE: This sheet is a summary. It may not cover all possible information. If you have questions about this medicine, talk to your doctor, pharmacist, or health care provider.  2020 Elsevier/Gold Standard (2017-08-14 12:15:37)   Please follow up with your doctor in 2 weeks for follow-up STI (sexually transmitted infection) testing.  Please text [phone] for 24 hour, 7 day a week crisis text support.  As discussed you have 5 days to return for evidence collection, please bring the clothes you were wearing with you in the paper bag provided.  Should you choose to report to law enforcement you will need the exact address where the event occurred so the appropriate law enforcement entity can take the report.  Also as discussed, it is crucial to take the [facility] as described. To be effective you must take the medication at the same time every day  and you must take the medication daily until you have taken all 28 tablets.  You have Metronidazole tablets to take for home use. As discussed, you must wait 72 hours after you have consumed alcohol prior to taking this medication, and then wait 72 hours after taking the medication before you have alcohol again.

## 2020-07-26 NOTE — SANE Note (Signed)
[employer] Coupon Card  RxBIN: [id number] RxPCN: ACCESS [id number] ISSUER: [phone])  ID: [id number]

## 2020-07-26 NOTE — SANE Note (Signed)
Follow-up Phone Call  Patient gives verbal consent for a FNE/SANE follow-up phone call in 48-72 hours: No Patient's telephone number: n/a Patient gives verbal consent to leave voicemail at the phone number listed above: No DO NOT CALL between the hours of: No permission to call  Patient states she works with [employer] and will find [place] if she needs any further information or help

## 2020-07-26 NOTE — ED Notes (Signed)
SANE nurse at bedside.

## 2020-07-26 NOTE — ED Notes (Signed)
Discharge and medication instructions reviewed by SANE nurse. Pt verbalized understanding of all medications and follow-up testing and treatment.

## 2020-07-27 ENCOUNTER — Other Ambulatory Visit (HOSPITAL_COMMUNITY): Payer: Self-pay

## 2020-07-27 LAB — RPR: RPR Ser Ql: NONREACTIVE

## 2020-07-28 ENCOUNTER — Other Ambulatory Visit (HOSPITAL_COMMUNITY): Payer: Self-pay

## 2020-07-28 DIAGNOSIS — F430 Acute stress reaction: Secondary | ICD-10-CM | POA: Diagnosis not present

## 2020-07-29 ENCOUNTER — Other Ambulatory Visit: Payer: Self-pay

## 2020-07-29 DIAGNOSIS — S20211A Contusion of right front wall of thorax, initial encounter: Secondary | ICD-10-CM | POA: Diagnosis not present

## 2020-07-29 MED ORDER — MELOXICAM 15 MG PO TABS
ORAL_TABLET | ORAL | 0 refills | Status: DC
  Filled 2020-07-29: qty 30, 30d supply, fill #0

## 2020-07-29 MED ORDER — CYCLOBENZAPRINE HCL 10 MG PO TABS
ORAL_TABLET | ORAL | 0 refills | Status: DC
  Filled 2020-07-29: qty 30, 10d supply, fill #0

## 2020-07-29 MED FILL — Ziprasidone HCl Cap 40 MG: ORAL | 30 days supply | Qty: 60 | Fill #0 | Status: AC

## 2020-07-30 ENCOUNTER — Other Ambulatory Visit (HOSPITAL_COMMUNITY): Payer: Self-pay

## 2020-08-04 ENCOUNTER — Other Ambulatory Visit: Payer: Self-pay

## 2020-08-04 DIAGNOSIS — N760 Acute vaginitis: Secondary | ICD-10-CM | POA: Diagnosis not present

## 2020-08-04 DIAGNOSIS — B373 Candidiasis of vulva and vagina: Secondary | ICD-10-CM | POA: Diagnosis not present

## 2020-08-04 DIAGNOSIS — F430 Acute stress reaction: Secondary | ICD-10-CM | POA: Diagnosis not present

## 2020-08-04 DIAGNOSIS — Z113 Encounter for screening for infections with a predominantly sexual mode of transmission: Secondary | ICD-10-CM | POA: Diagnosis not present

## 2020-08-04 DIAGNOSIS — Z3202 Encounter for pregnancy test, result negative: Secondary | ICD-10-CM | POA: Diagnosis not present

## 2020-08-04 DIAGNOSIS — R300 Dysuria: Secondary | ICD-10-CM | POA: Diagnosis not present

## 2020-08-04 MED ORDER — METRONIDAZOLE 500 MG PO TABS
ORAL_TABLET | ORAL | 0 refills | Status: DC
  Filled 2020-08-04: qty 14, 7d supply, fill #0

## 2020-08-04 MED ORDER — FLUCONAZOLE 150 MG PO TABS
ORAL_TABLET | ORAL | 0 refills | Status: DC
  Filled 2020-08-04: qty 2, 3d supply, fill #0

## 2020-08-04 MED FILL — Sertraline HCl Tab 100 MG: ORAL | 30 days supply | Qty: 30 | Fill #0 | Status: AC

## 2020-08-07 ENCOUNTER — Other Ambulatory Visit: Payer: Self-pay

## 2020-08-07 MED ORDER — NITROFURANTOIN MONOHYD MACRO 100 MG PO CAPS
ORAL_CAPSULE | ORAL | 0 refills | Status: DC
  Filled 2020-08-07: qty 10, 5d supply, fill #0

## 2020-08-11 DIAGNOSIS — F430 Acute stress reaction: Secondary | ICD-10-CM | POA: Diagnosis not present

## 2020-08-14 ENCOUNTER — Other Ambulatory Visit: Payer: Self-pay

## 2020-08-14 ENCOUNTER — Other Ambulatory Visit: Payer: Self-pay | Admitting: Physician Assistant

## 2020-08-14 MED ORDER — TRAZODONE HCL 100 MG PO TABS
100.0000 mg | ORAL_TABLET | Freq: Every day | ORAL | 3 refills | Status: DC
  Filled 2020-08-14: qty 60, 30d supply, fill #0
  Filled 2020-10-14: qty 60, 30d supply, fill #1
  Filled 2020-11-25: qty 60, 30d supply, fill #2
  Filled 2021-01-21: qty 60, 30d supply, fill #3

## 2020-08-18 DIAGNOSIS — F430 Acute stress reaction: Secondary | ICD-10-CM | POA: Diagnosis not present

## 2020-08-25 DIAGNOSIS — F430 Acute stress reaction: Secondary | ICD-10-CM | POA: Diagnosis not present

## 2020-08-25 DIAGNOSIS — F1099 Alcohol use, unspecified with unspecified alcohol-induced disorder: Secondary | ICD-10-CM | POA: Diagnosis not present

## 2020-09-01 DIAGNOSIS — F430 Acute stress reaction: Secondary | ICD-10-CM | POA: Diagnosis not present

## 2020-09-01 DIAGNOSIS — F1099 Alcohol use, unspecified with unspecified alcohol-induced disorder: Secondary | ICD-10-CM | POA: Diagnosis not present

## 2020-09-15 DIAGNOSIS — F1099 Alcohol use, unspecified with unspecified alcohol-induced disorder: Secondary | ICD-10-CM | POA: Diagnosis not present

## 2020-09-15 DIAGNOSIS — F430 Acute stress reaction: Secondary | ICD-10-CM | POA: Diagnosis not present

## 2020-09-16 ENCOUNTER — Other Ambulatory Visit: Payer: Self-pay

## 2020-09-16 MED FILL — Ziprasidone HCl Cap 40 MG: ORAL | 30 days supply | Qty: 60 | Fill #1 | Status: AC

## 2020-09-16 MED FILL — Topiramate Tab 50 MG: ORAL | 30 days supply | Qty: 30 | Fill #0 | Status: AC

## 2020-09-16 MED FILL — Sertraline HCl Tab 100 MG: ORAL | 30 days supply | Qty: 30 | Fill #1 | Status: AC

## 2020-09-22 DIAGNOSIS — F430 Acute stress reaction: Secondary | ICD-10-CM | POA: Diagnosis not present

## 2020-09-22 DIAGNOSIS — F1099 Alcohol use, unspecified with unspecified alcohol-induced disorder: Secondary | ICD-10-CM | POA: Diagnosis not present

## 2020-09-29 DIAGNOSIS — F1099 Alcohol use, unspecified with unspecified alcohol-induced disorder: Secondary | ICD-10-CM | POA: Diagnosis not present

## 2020-09-29 DIAGNOSIS — F430 Acute stress reaction: Secondary | ICD-10-CM | POA: Diagnosis not present

## 2020-10-14 ENCOUNTER — Other Ambulatory Visit: Payer: Self-pay

## 2020-10-14 MED ORDER — MECLIZINE HCL 25 MG PO TABS
ORAL_TABLET | ORAL | 0 refills | Status: DC
  Filled 2020-10-14: qty 9, 3d supply, fill #0

## 2020-10-20 DIAGNOSIS — F430 Acute stress reaction: Secondary | ICD-10-CM | POA: Diagnosis not present

## 2020-10-20 DIAGNOSIS — F1099 Alcohol use, unspecified with unspecified alcohol-induced disorder: Secondary | ICD-10-CM | POA: Diagnosis not present

## 2020-10-27 DIAGNOSIS — F430 Acute stress reaction: Secondary | ICD-10-CM | POA: Diagnosis not present

## 2020-10-27 DIAGNOSIS — F1099 Alcohol use, unspecified with unspecified alcohol-induced disorder: Secondary | ICD-10-CM | POA: Diagnosis not present

## 2020-11-03 DIAGNOSIS — F430 Acute stress reaction: Secondary | ICD-10-CM | POA: Diagnosis not present

## 2020-11-03 DIAGNOSIS — F1099 Alcohol use, unspecified with unspecified alcohol-induced disorder: Secondary | ICD-10-CM | POA: Diagnosis not present

## 2020-11-10 DIAGNOSIS — F430 Acute stress reaction: Secondary | ICD-10-CM | POA: Diagnosis not present

## 2020-11-10 DIAGNOSIS — F1099 Alcohol use, unspecified with unspecified alcohol-induced disorder: Secondary | ICD-10-CM | POA: Diagnosis not present

## 2020-11-25 ENCOUNTER — Other Ambulatory Visit: Payer: Self-pay

## 2020-11-25 MED FILL — Topiramate Tab 50 MG: ORAL | 30 days supply | Qty: 30 | Fill #1 | Status: AC

## 2020-11-25 MED FILL — Sertraline HCl Tab 100 MG: ORAL | 30 days supply | Qty: 30 | Fill #2 | Status: AC

## 2020-11-25 MED FILL — Ziprasidone HCl Cap 40 MG: ORAL | 30 days supply | Qty: 60 | Fill #2 | Status: AC

## 2020-12-01 DIAGNOSIS — F430 Acute stress reaction: Secondary | ICD-10-CM | POA: Diagnosis not present

## 2020-12-01 DIAGNOSIS — F1099 Alcohol use, unspecified with unspecified alcohol-induced disorder: Secondary | ICD-10-CM | POA: Diagnosis not present

## 2020-12-14 ENCOUNTER — Other Ambulatory Visit: Payer: Self-pay

## 2020-12-14 DIAGNOSIS — F250 Schizoaffective disorder, bipolar type: Secondary | ICD-10-CM | POA: Diagnosis not present

## 2020-12-14 DIAGNOSIS — G259 Extrapyramidal and movement disorder, unspecified: Secondary | ICD-10-CM | POA: Diagnosis not present

## 2020-12-14 DIAGNOSIS — F1320 Sedative, hypnotic or anxiolytic dependence, uncomplicated: Secondary | ICD-10-CM | POA: Diagnosis not present

## 2020-12-14 DIAGNOSIS — F411 Generalized anxiety disorder: Secondary | ICD-10-CM | POA: Diagnosis not present

## 2020-12-14 DIAGNOSIS — Z79899 Other long term (current) drug therapy: Secondary | ICD-10-CM | POA: Diagnosis not present

## 2020-12-14 DIAGNOSIS — Z5181 Encounter for therapeutic drug level monitoring: Secondary | ICD-10-CM | POA: Diagnosis not present

## 2020-12-14 MED ORDER — TOPIRAMATE 50 MG PO TABS
ORAL_TABLET | ORAL | 5 refills | Status: DC
  Filled 2020-12-14: qty 30, 30d supply, fill #0
  Filled 2021-02-23: qty 30, 30d supply, fill #1
  Filled 2021-04-21: qty 30, 30d supply, fill #2
  Filled 2021-05-25: qty 30, 30d supply, fill #3

## 2020-12-14 MED ORDER — SERTRALINE HCL 100 MG PO TABS
ORAL_TABLET | ORAL | 5 refills | Status: DC
  Filled 2020-12-14: qty 30, 30d supply, fill #0
  Filled 2021-02-10: qty 30, 30d supply, fill #1

## 2020-12-14 MED ORDER — ZIPRASIDONE HCL 40 MG PO CAPS
ORAL_CAPSULE | ORAL | 5 refills | Status: DC
  Filled 2020-12-14 – 2021-01-21 (×2): qty 60, 30d supply, fill #0
  Filled 2021-04-21: qty 60, 30d supply, fill #1
  Filled 2021-05-25: qty 60, 30d supply, fill #2

## 2020-12-15 DIAGNOSIS — F430 Acute stress reaction: Secondary | ICD-10-CM | POA: Diagnosis not present

## 2020-12-15 DIAGNOSIS — F1099 Alcohol use, unspecified with unspecified alcohol-induced disorder: Secondary | ICD-10-CM | POA: Diagnosis not present

## 2020-12-18 ENCOUNTER — Other Ambulatory Visit: Payer: Self-pay

## 2020-12-18 MED ORDER — AUSTEDO 6 MG PO TABS
ORAL_TABLET | ORAL | 3 refills | Status: DC
  Filled 2020-12-18 – 2020-12-23 (×3): qty 60, 30d supply, fill #0

## 2020-12-22 ENCOUNTER — Other Ambulatory Visit: Payer: Self-pay

## 2020-12-22 DIAGNOSIS — F1099 Alcohol use, unspecified with unspecified alcohol-induced disorder: Secondary | ICD-10-CM | POA: Diagnosis not present

## 2020-12-22 DIAGNOSIS — F430 Acute stress reaction: Secondary | ICD-10-CM | POA: Diagnosis not present

## 2020-12-23 ENCOUNTER — Other Ambulatory Visit: Payer: Self-pay

## 2020-12-24 ENCOUNTER — Other Ambulatory Visit: Payer: Self-pay

## 2020-12-25 ENCOUNTER — Other Ambulatory Visit: Payer: Self-pay

## 2020-12-29 DIAGNOSIS — F430 Acute stress reaction: Secondary | ICD-10-CM | POA: Diagnosis not present

## 2020-12-29 DIAGNOSIS — F1099 Alcohol use, unspecified with unspecified alcohol-induced disorder: Secondary | ICD-10-CM | POA: Diagnosis not present

## 2021-01-05 DIAGNOSIS — F430 Acute stress reaction: Secondary | ICD-10-CM | POA: Diagnosis not present

## 2021-01-05 DIAGNOSIS — F1099 Alcohol use, unspecified with unspecified alcohol-induced disorder: Secondary | ICD-10-CM | POA: Diagnosis not present

## 2021-01-21 ENCOUNTER — Other Ambulatory Visit: Payer: Self-pay

## 2021-01-22 ENCOUNTER — Other Ambulatory Visit: Payer: Self-pay

## 2021-01-23 DIAGNOSIS — F411 Generalized anxiety disorder: Secondary | ICD-10-CM | POA: Diagnosis not present

## 2021-01-23 DIAGNOSIS — G259 Extrapyramidal and movement disorder, unspecified: Secondary | ICD-10-CM | POA: Diagnosis not present

## 2021-01-23 DIAGNOSIS — F250 Schizoaffective disorder, bipolar type: Secondary | ICD-10-CM | POA: Diagnosis not present

## 2021-01-25 ENCOUNTER — Other Ambulatory Visit: Payer: Self-pay

## 2021-01-25 MED ORDER — INGREZZA 40 MG PO CAPS
ORAL_CAPSULE | ORAL | 5 refills | Status: DC
  Filled 2021-01-25: qty 30, 30d supply, fill #0
  Filled 2021-04-21: qty 30, 30d supply, fill #1
  Filled 2021-05-25: qty 30, 30d supply, fill #2

## 2021-01-26 ENCOUNTER — Other Ambulatory Visit: Payer: Self-pay

## 2021-02-01 ENCOUNTER — Other Ambulatory Visit (HOSPITAL_COMMUNITY): Payer: Self-pay

## 2021-02-02 DIAGNOSIS — F1099 Alcohol use, unspecified with unspecified alcohol-induced disorder: Secondary | ICD-10-CM | POA: Diagnosis not present

## 2021-02-02 DIAGNOSIS — F430 Acute stress reaction: Secondary | ICD-10-CM | POA: Diagnosis not present

## 2021-02-09 ENCOUNTER — Other Ambulatory Visit: Payer: Self-pay

## 2021-02-09 DIAGNOSIS — F430 Acute stress reaction: Secondary | ICD-10-CM | POA: Diagnosis not present

## 2021-02-09 DIAGNOSIS — F1099 Alcohol use, unspecified with unspecified alcohol-induced disorder: Secondary | ICD-10-CM | POA: Diagnosis not present

## 2021-02-10 ENCOUNTER — Other Ambulatory Visit: Payer: Self-pay

## 2021-02-11 ENCOUNTER — Other Ambulatory Visit: Payer: Self-pay

## 2021-02-16 DIAGNOSIS — F430 Acute stress reaction: Secondary | ICD-10-CM | POA: Diagnosis not present

## 2021-02-16 DIAGNOSIS — F1099 Alcohol use, unspecified with unspecified alcohol-induced disorder: Secondary | ICD-10-CM | POA: Diagnosis not present

## 2021-02-22 ENCOUNTER — Other Ambulatory Visit: Payer: Self-pay

## 2021-02-22 ENCOUNTER — Emergency Department
Admission: EM | Admit: 2021-02-22 | Discharge: 2021-02-22 | Disposition: A | Discharge status: Home / Self Care | Payer: 59 | Attending: Emergency Medicine | Admitting: Emergency Medicine

## 2021-02-22 ENCOUNTER — Emergency Department: Payer: 59

## 2021-02-22 DIAGNOSIS — F17210 Nicotine dependence, cigarettes, uncomplicated: Secondary | ICD-10-CM | POA: Diagnosis not present

## 2021-02-22 DIAGNOSIS — R519 Headache, unspecified: Secondary | ICD-10-CM | POA: Insufficient documentation

## 2021-02-22 DIAGNOSIS — H5589 Other irregular eye movements: Secondary | ICD-10-CM | POA: Diagnosis not present

## 2021-02-22 DIAGNOSIS — H519 Unspecified disorder of binocular movement: Secondary | ICD-10-CM

## 2021-02-22 DIAGNOSIS — H5789 Other specified disorders of eye and adnexa: Secondary | ICD-10-CM | POA: Diagnosis not present

## 2021-02-22 MED ORDER — DIPHENHYDRAMINE HCL 25 MG PO CAPS
50.0000 mg | ORAL_CAPSULE | Freq: Once | ORAL | Status: AC
  Administered 2021-02-22: 50 mg via ORAL
  Filled 2021-02-22: qty 2

## 2021-02-22 NOTE — ED Provider Notes (Signed)
[facility] Emergency Department Provider Note  ____________________________________________   None    (approximate)  I have reviewed the triage vital signs and the nursing notes.   HISTORY  Chief Complaint Eye Problem   HPI Sue Jones is a 26 y.o. female with a past medical history of anxiety, bipolar disorder, depression and migraine headaches on ziprasidone, Topamax, trazodone, Celexa, as needed Klonopin, and Austedo for tardive dyskinesia who presents for assessment of difficulty looking down that started few hours ago.  Patient states he has had episodes over the past month perhaps 10 in total or for a few hours she feels like her eyes are "stuck looking up".  She states that these typically resolve on her own and she has been speaking with her psychiatrist about this who is concerned these may be related to some tardive dyskinesia issues.  She denies any headache, earache, sore throat, vision changes including blurry vision or double vision, eye pain or other clear associated sick symptoms.  No fevers, chills, cough, vomiting, diarrhea, burning with urination, rash or recent injuries or falls.  She does wear glasses but does not wear contacts.  No recent trauma.  No recent medication changes or illicit drug use.  No other acute concerns at this time.         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 No pertinent surgical history.  Prior to Admission medications   Medication Sig Start Date End Date Taking? Authorizing Provider  citalopram (CELEXA) 20 MG tablet TAKE 1 TABLET BY MOUTH EVERY  DAY 01/16/18   Particia Nearing, PA-C  clonazePAM (KLONOPIN) 0.5 MG tablet TAKE ONE TABLET BY MOUTH DAILY AS NEEDED 02/08/20 08/06/20  Kerry Hough, PA-C  Deutetrabenazine (AUSTEDO) 6 MG TABS take one tablet orally BID 12/17/20     Etonogestrel (NEXPLANON Page) Inject into the skin.    [provider]  meloxicam (MOBIC) 15 MG tablet Take 1 tablet (15 mg total) by mouth 1 (one) time each day. 07/29/20     NICOTINE STEP 3 7 MG/24HR patch PLACE 1 PATCH ON THE SKIN DAILY. PLEASE PLACE 2 PATCHES DAILY (PLACE FOR 12HRS, REMOVE AT BEDTIME) FOR 1 WEEK, THEN PLACE 1 DAILY FOR 2WKS. 02/12/19   Particia Nearing, PA-C  ondansetron (ZOFRAN) 4 MG tablet Take 1 tablet (4 mg total) by mouth every 8 (eight) hours as needed for nausea or vomiting. 03/22/18   Jeanmarie Plant, MD  PROAIR HFA 108 [phone] Base) MCG/ACT inhaler TAKE 2 PUFFS BY MOUTH EVERY 6 HOURS AS NEEDED FOR WHEEZE 08/07/17   Particia Nearing, PA-C  sertraline (ZOLOFT) 100 MG tablet TAKE ONE TABLET BY MOUTH EVERY MORNING 06/06/20 06/06/21  Donell Sievert E, PA-C  sertraline (ZOLOFT) 100 MG tablet take one tab orally q am 12/14/20     sertraline (ZOLOFT) 50 MG tablet TAKE ONE TABLET BY MOUTH DAILY 02/08/20 02/07/21  Donell Sievert E, PA-C  sertraline (ZOLOFT) 50 MG tablet TAKE 1 TABLET BY MOUTH AT BEDTIME 02/04/20 02/03/21    topiramate (TOPAMAX) 25 MG tablet TAKE 1 TABLET BY MOUTH DAILY 02/13/18  [provider]  topiramate (TOPAMAX) 50 MG tablet TAKE ONE TABLET BY MOUTH ONCE A DAY 06/06/20 06/06/21  Donell Sievert E, PA-C  topiramate (TOPAMAX) 50 MG tablet TAKE ONE TABLET BY MOUTH DAILY 02/08/20 02/07/21  Donell Sievert E, PA-C  topiramate (TOPAMAX) 50 MG tablet TAKE ONE TABLET BY MOUTH DAILY 11/23/19 11/22/20  Kerry Hough, PA-C  topiramate (TOPAMAX) 50 MG tablet take one tab orally q day 12/14/20     traZODone (DESYREL) 100 MG tablet TAKE 2 TABLETS BY MOUTH AT BEDTIME 02/04/20 02/03/21    traZODone (DESYREL) 100 MG tablet TAKE  ONE TO TWO TABLETS BY MOUTH AT BEDTIME 11/23/19 11/22/20  Kerry Hough, PA-C  traZODone (DESYREL) 100 MG tablet Take 1 to 2 tablets by mouth daily as needed. 08/14/20     traZODone (DESYREL) 50 MG tablet TAKE ONE TO TWO TABLETS AT BEDTIME 02/18/18   [provider]  valbenazine [facility]) 40 MG capsule Take one capsule orally q hs 01/23/21     ziprasidone (GEODON) 40 MG capsule TAKE ONE CAPSULE BY MOUTH 2 TIMES DAILY 06/06/20 06/06/21  Donell Sievert E, PA-C  ziprasidone (GEODON) 40 MG capsule TAKE ONE CAPSULE BY MOUTH TWO TIMES DAILY 02/08/20 02/07/21  Donell Sievert E, PA-C  ziprasidone (GEODON) 40 MG capsule TAKE 1 CAPSULE BY MOUTH TWICE DAILY 02/04/20 02/03/21    ziprasidone (GEODON) 40 MG capsule take one capsule orally BID 12/14/20       Allergies Cymbalta [duloxetine hcl] and Vraylar [cariprazine hcl]  Family History  Problem Relation Age of Onset   Cancer Maternal Grandmother    Heart disease Maternal Grandmother    Cancer Maternal Grandfather    Heart disease Maternal Grandfather    COPD Neg Hx    Diabetes Neg Hx    Hypertension Neg Hx    Stroke Neg Hx     Social History Social History   Tobacco Use   Smoking status: Every Day    Packs/day: 0.30    Types: Cigarettes   Smokeless tobacco: Never  Vaping Use   Vaping Use: Never used  Substance Use Topics   Alcohol use: Yes   Drug use: Not Currently    Types: Marijuana    Review of Systems  Review of Systems  Constitutional:  Negative for chills and fever.  HENT:  Negative for sore throat.   Eyes:  Negative for blurred vision, double vision, photophobia, pain, discharge and redness.  Respiratory:  Negative for cough and stridor.   Cardiovascular:  Negative for chest pain.  Gastrointestinal:  Negative for vomiting.  Skin:  Negative for rash.  Neurological:  Negative for seizures, loss of consciousness and headaches.  Psychiatric/Behavioral:  Negative for suicidal ideas.   All other systems reviewed and are  negative.    ____________________________________________   PHYSICAL EXAM:  VITAL SIGNS: ED Triage Vitals  Enc Vitals Group     BP 02/22/21 1020 130/81     Pulse Rate 02/22/21 1020 88     Resp 02/22/21 1020 18     Temp 02/22/21 1020 97.9 F (36.6 C)     Temp Source 02/22/21 1020 Oral     SpO2 02/22/21 1020 99 %     Weight 02/22/21 1020 160 lb (72.6 kg)     Height 02/22/21 1020 5\' 7"  (1.702 m)     Head Circumference --      Peak Flow --      Pain Score 02/22/21 1017 0     Pain Loc --  Pain Edu? --      Excl. in GC? --    Vitals:   02/22/21 1020  BP: 130/81  Pulse: 88  Resp: 18  Temp: 97.9 F (36.6 C)  SpO2: 99%   Physical Exam Vitals and nursing note reviewed.  Constitutional:      General: She is not in acute distress.    Appearance: She is well-developed.  HENT:     Head: Normocephalic and atraumatic.     Right Ear: External ear normal.     Left Ear: External ear normal.     Nose: Nose normal.  Eyes:     Conjunctiva/sclera: Conjunctivae normal.  Cardiovascular:     Rate and Rhythm: Normal rate and regular rhythm.     Heart sounds: No murmur heard. Pulmonary:     Effort: Pulmonary effort is normal. No respiratory distress.     Breath sounds: Normal breath sounds.  Abdominal:     Palpations: Abdomen is soft.     Tenderness: There is no abdominal tenderness.  Musculoskeletal:     Cervical back: Neck supple.  Skin:    General: Skin is warm and dry.     Capillary Refill: Capillary refill takes less than 2 seconds.  Neurological:     Mental Status: She is alert and oriented to person, place, and time.  Psychiatric:        Mood and Affect: Mood normal.    Cranial nerves II through XII are grossly intact.  Patient is able to look in all quadrants including inferiorly although seems to at rest look either straightahead or slightly up.  No pronator drift.  No finger dysmetria.  She has full strength and sensation throughout all extremities.  Visual acuity  is 20/30 bilaterally.  This is corrected vision. ____________________________________________   LABS (all labs ordered are listed, but only abnormal results are displayed)  Labs Reviewed - No data to display ____________________________________________  EKG  ____________________________________________  RADIOLOGY  ED MD interpretation: CT head with no evidence of ischemia, edema, mass-effect or hemorrhage.  Official radiology report(s): CT HEAD WO CONTRAST ( )  Result Date: 02/22/2021 CLINICAL DATA:  Migraine-type headache. EXAM: CT HEAD WITHOUT CONTRAST TECHNIQUE: Contiguous axial images were obtained from the base of the skull through the vertex without intravenous contrast. COMPARISON:  01/01/2016 FINDINGS: Brain: Normal appearing cerebral hemispheres and posterior fossa structures. Normal size and position of the ventricles. No intracranial hemorrhage, mass lesion or CT evidence of acute infarction. Vascular: No hyperdense vessel or unexpected calcification. Skull: Normal. Negative for fracture or focal lesion. Sinuses/Orbits: Unremarkable. Other: None. IMPRESSION: Normal examination. Electronically Signed   By: Beckie Salts M.D.   On: 02/22/2021 11:11    ____________________________________________   PROCEDURES  Procedure(s) performed (including Critical Care):  Procedures   ____________________________________________   INITIAL IMPRESSION / ASSESSMENT AND PLAN / ED COURSE      Patient presents with left ear history exam for assessment of "eyes being stuck looking up".  On arrival she is afebrile hemodynamically stable.  On history it seems these episodes of been ongoing for the past month and she has been speaking with her psychiatrist about this and feels it may be related to some ocular tardive dyskinesia.  She states that went from when it started a couple hours ago he feels that it is already improving and that she now she can look down for a few seconds at a time.   On exam she otherwise has symmetric acuity without any evidence of edema,  injection, proptosis, periorbital findings or other cranial nerve deficit.  There is no history of trauma.  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  At this time I have a low suspicion for venous sinus thrombosis or acute enlarging aneurysm given absence of anisocoria or objective entrapment or other abnormalities noted on exam.  Offered patient trial of Benadryl I do not believe this is absolutely necessary given it has been improving on its own over the last hour or so.  She is amenable to this and will plan to get a ride home and take the rest the day off from work.  We discussed following up with her psychiatrist over the phone today to see if they want to make any additional changes to her medications.  She will return immediately if she experiences any worsening of symptoms, blurry vision, double vision, pain, headache or any other acute sick symptoms.  Discharged in stable condition.  Strict return precautions advised and discussed.       ____________________________________________   FINAL CLINICAL IMPRESSION(S) / ED DIAGNOSES  Final diagnoses:  Abnormal eye movements    Medications  diphenhydrAMINE (BENADRYL) capsule 50 mg (50 mg Oral Given 02/22/21 1139)     ED Discharge Orders     None        Note:  This document was prepared using Dragon voice recognition software and may include unintentional dictation errors.    Gilles Chiquito, MD 02/22/21 1723

## 2021-02-22 NOTE — ED Triage Notes (Signed)
Pt to ED from work for "eyes stuck upward". States has happened before. NAD noted.  Denies hx seizures. Pt fluttering eyes closed and open looking up.   Verbal ct head dr Scotty Court

## 2021-02-23 ENCOUNTER — Other Ambulatory Visit: Payer: Self-pay

## 2021-02-23 DIAGNOSIS — F430 Acute stress reaction: Secondary | ICD-10-CM | POA: Diagnosis not present

## 2021-02-23 DIAGNOSIS — F1099 Alcohol use, unspecified with unspecified alcohol-induced disorder: Secondary | ICD-10-CM | POA: Diagnosis not present

## 2021-02-28 ENCOUNTER — Other Ambulatory Visit: Payer: Self-pay

## 2021-02-28 DIAGNOSIS — K920 Hematemesis: Secondary | ICD-10-CM | POA: Insufficient documentation

## 2021-02-28 DIAGNOSIS — R197 Diarrhea, unspecified: Secondary | ICD-10-CM | POA: Insufficient documentation

## 2021-02-28 DIAGNOSIS — R109 Unspecified abdominal pain: Secondary | ICD-10-CM | POA: Diagnosis not present

## 2021-02-28 DIAGNOSIS — Z5321 Procedure and treatment not carried out due to patient leaving prior to being seen by health care provider: Secondary | ICD-10-CM | POA: Diagnosis not present

## 2021-02-28 LAB — COMPREHENSIVE METABOLIC PANEL
ALT: 22 U/L (ref 0–44)
AST: 30 U/L (ref 15–41)
Albumin: 4.4 g/dL (ref 3.5–5.0)
Alkaline Phosphatase: 79 U/L (ref 38–126)
Anion gap: 5 (ref 5–15)
BUN: 13 mg/dL (ref 6–20)
CO2: 25 mmol/L (ref 22–32)
Calcium: 9 mg/dL (ref 8.9–10.3)
Chloride: 104 mmol/L (ref 98–111)
Creatinine, Ser: 0.72 mg/dL (ref 0.44–1.00)
GFR, Estimated: >60 mL/min (ref >60)
Glucose, Bld: 113 mg/dL — ABNORMAL HIGH (ref 70–99)
Potassium: 3.2 mmol/L — ABNORMAL LOW (ref 3.5–5.1)
Sodium: 134 mmol/L — ABNORMAL LOW (ref 135–145)
Total Bilirubin: 0.8 mg/dL (ref 0.3–1.2)
Total Protein: 7.7 g/dL (ref 6.5–8.1)

## 2021-02-28 LAB — CBC
HCT: 39.8 % (ref 36.0–46.0)
Hemoglobin: 13.2 g/dL (ref 12.0–15.0)
MCH: 31 pg (ref 26.0–34.0)
MCHC: 33.2 g/dL (ref 30.0–36.0)
MCV: 93.4 fL (ref 80.0–100.0)
Platelets: 304 10*3/uL (ref 150–400)
RBC: 4.26 MIL/uL (ref 3.87–5.11)
RDW: 12.9 % (ref 11.5–15.5)
WBC: 10.4 10*3/uL (ref 4.0–10.5)
nRBC: 0 % (ref 0.0–0.2)

## 2021-02-28 LAB — URINALYSIS, ROUTINE W REFLEX MICROSCOPIC
Bilirubin Urine: NEGATIVE
Glucose, UA: NEGATIVE mg/dL
Hgb urine dipstick: NEGATIVE
Ketones, ur: NEGATIVE mg/dL
Leukocytes,Ua: NEGATIVE
Nitrite: NEGATIVE
Protein, ur: NEGATIVE mg/dL
Specific Gravity, Urine: 1.006 (ref 1.005–1.030)
pH: 5 (ref 5.0–8.0)

## 2021-02-28 LAB — POC URINE PREG, ED: Preg Test, Ur: NEGATIVE

## 2021-02-28 LAB — LIPASE, BLOOD: Lipase: 29 U/L (ref 11–51)

## 2021-02-28 MED ORDER — ONDANSETRON 4 MG PO TBDP
4.0000 mg | ORAL_TABLET | Freq: Once | ORAL | Status: AC | PRN
  Administered 2021-02-28: 4 mg via ORAL
  Filled 2021-02-28: qty 1

## 2021-02-28 NOTE — ED Notes (Signed)
Pt reports drinking vodka today- approximately 4-5 shots, denies drug use or eating anything abnormal.

## 2021-02-28 NOTE — ED Triage Notes (Signed)
Pt c/o bright red blood in vomit that began today around 12 pm, states she threw up x2. Pt had abdominal pain this morning that has since resolved. Pt is AOX4, NAD noted. Pt reports she has chronic diarrhea. Pt denies dizziness, CP, SHOB.

## 2021-02-28 NOTE — ED Notes (Signed)
POC urine pregnancy negative at this time. 

## 2021-03-01 ENCOUNTER — Emergency Department
Admission: EM | Admit: 2021-03-01 | Discharge: 2021-03-01 | Disposition: A | Discharge status: Home / Self Care | Payer: 59 | Attending: Emergency Medicine | Admitting: Emergency Medicine

## 2021-03-01 NOTE — ED Notes (Signed)
Dawn, rn looking for pt in lobby to come to room 20.

## 2021-03-02 DIAGNOSIS — F1099 Alcohol use, unspecified with unspecified alcohol-induced disorder: Secondary | ICD-10-CM | POA: Diagnosis not present

## 2021-03-02 DIAGNOSIS — F430 Acute stress reaction: Secondary | ICD-10-CM | POA: Diagnosis not present

## 2021-03-09 DIAGNOSIS — F1099 Alcohol use, unspecified with unspecified alcohol-induced disorder: Secondary | ICD-10-CM | POA: Diagnosis not present

## 2021-03-09 DIAGNOSIS — F430 Acute stress reaction: Secondary | ICD-10-CM | POA: Diagnosis not present

## 2021-03-16 DIAGNOSIS — F4310 Post-traumatic stress disorder, unspecified: Secondary | ICD-10-CM | POA: Diagnosis not present

## 2021-03-30 DIAGNOSIS — F4310 Post-traumatic stress disorder, unspecified: Secondary | ICD-10-CM | POA: Diagnosis not present

## 2021-04-06 DIAGNOSIS — F4310 Post-traumatic stress disorder, unspecified: Secondary | ICD-10-CM | POA: Diagnosis not present

## 2021-04-20 DIAGNOSIS — F4310 Post-traumatic stress disorder, unspecified: Secondary | ICD-10-CM | POA: Diagnosis not present

## 2021-04-21 ENCOUNTER — Other Ambulatory Visit: Payer: Self-pay

## 2021-04-22 ENCOUNTER — Other Ambulatory Visit: Payer: Self-pay

## 2021-04-26 ENCOUNTER — Other Ambulatory Visit (HOSPITAL_COMMUNITY): Payer: Self-pay

## 2021-04-27 DIAGNOSIS — F4310 Post-traumatic stress disorder, unspecified: Secondary | ICD-10-CM | POA: Diagnosis not present

## 2021-05-11 DIAGNOSIS — F4310 Post-traumatic stress disorder, unspecified: Secondary | ICD-10-CM | POA: Diagnosis not present

## 2021-05-25 ENCOUNTER — Other Ambulatory Visit: Payer: Self-pay

## 2021-05-25 ENCOUNTER — Emergency Department
Admission: EM | Admit: 2021-05-25 | Discharge: 2021-05-25 | Disposition: A | Discharge status: Home / Self Care | Payer: 59 | Attending: Emergency Medicine | Admitting: Emergency Medicine

## 2021-05-25 DIAGNOSIS — F4310 Post-traumatic stress disorder, unspecified: Secondary | ICD-10-CM | POA: Diagnosis not present

## 2021-05-25 DIAGNOSIS — H519 Unspecified disorder of binocular movement: Secondary | ICD-10-CM

## 2021-05-25 DIAGNOSIS — H5589 Other irregular eye movements: Secondary | ICD-10-CM | POA: Diagnosis not present

## 2021-05-25 MED ORDER — DIPHENHYDRAMINE HCL 50 MG/ML IJ SOLN
50.0000 mg | Freq: Once | INTRAMUSCULAR | Status: AC
  Administered 2021-05-25: 50 mg via INTRAMUSCULAR
  Filled 2021-05-25: qty 1

## 2021-05-25 NOTE — ED Provider Notes (Signed)
[facility] Provider Note    Event Date/Time   First MD Initiated Contact with Patient 05/25/21 1121     (approximate)   History   Chief Complaint No chief complaint on file.   HPI Sue Jones is a 26 y.o. female, bipolar 1 disorder, BPD, depression/anxiety, presents to the emergency department for evaluation of eye problem.  Patient states that she feels that her eyes are stuck upward.  She states that this is happened before a few months ago where she presented to the emergency department, but was told that there is nothing that they could do about it.  She states the episode started approximately 30 minutes ago.  Since being here, she states that her symptoms have started to subside spontaneously. Denies fever/chills, blurred vision, hearing changes, sore throat, neck pain, chest pain, shortness of breath, or rashes lesions  Of note, patient was last seen here on 02/22/2021 where she presented with similar symptoms.  Patient was treated with p.o. 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  She responded well to the treatment.  History Limitations: No limitations.      Physical Exam  Triage Vital Signs: ED Triage Vitals  Enc Vitals Group     BP 05/25/21 1113 127/82     Pulse Rate 05/25/21 1113 (!) 102     Resp 05/25/21 1113 16     Temp 05/25/21 1113 99 F (37.2 C)     Temp src --      SpO2 05/25/21 1113 100 %     Weight 05/25/21 1114 160 lb (72.6 kg)     Height 05/25/21 1114 5\' 7"  (1.702 m)     Head Circumference --      Peak Flow --      Pain Score 05/25/21 1114 0     Pain Loc --      Pain Edu? --      Excl. in [place]? --     Most recent vital signs: Vitals:   05/25/21 1113 05/25/21 1356  BP: 127/82 122/78  Pulse: (!) 102 98  Resp: 16 20  Temp: 99 F (37.2 C)   SpO2: 100% 100%    General: Awake, NAD.  CV: Good peripheral perfusion.  Resp: Normal effort.  Abd: Soft, non-tender. No distention.  Neuro: At  baseline.  Cranial nerves II through XII intact Other: Visual acuity intact.  Pupils equal round and reactive to light.  Extraocular movements intact, though does appear to be some notable resistance when gazing downward before her eyes are fully able to fixate downwards.  No surrounding edema or erythema around the orbits.   Physical Exam    ED Results / Procedures / Treatments  Labs (all labs ordered are listed, but only abnormal results are displayed) Labs Reviewed - No data to display   EKG Not applicable   RADIOLOGY  ED Provider Interpretation: None.  No results found.  PROCEDURES:  Critical Care performed: None  Procedures    MEDICATIONS ORDERED IN ED: Medications  diphenhydrAMINE (BENADRYL) injection 50 mg (50 mg Intramuscular Given 05/25/21 1157)     IMPRESSION / MDM / ASSESSMENT AND PLAN / ED COURSE  I reviewed the triage vital signs and the nursing notes.                              Sue Jones is a 26 y.o. female, bipolar 1 disorder, BPD, depression/anxiety,  presents to the emergency department for evaluation of eye problem.  Patient states that she feels that her eyes are stuck upward.  She states that this is happened before a few months ago where she presented to the emergency department, but was told that there is nothing that they could do about it.  She states the episode started approximately 30 minutes ago.  Differential diagnosis includes, but is not limited to, tardive dyskinesia, oculogyric crisis, ocular entrapment, stroke/CVA, central venous thrombosis  ED Course Patient appears well.  Vital signs within normal limits.  She is in no apparent distress and symptoms are beginning to subside.  We will go ahead and treat with 50 mg diphenhydramine IM.  Upon reexamination, patient states that her symptoms have mostly resolved.   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  Very low suspicion for serious  or life-threatening pathology given her presentation and the gradual resolution of symptoms.  Patient is on several medications that may be exacerbating, though patient states that she has not taken anything new in the past few weeks.  Response to diphenhydramine is reassuring.  She states that she has never had an episode last more than a few hours and that this presentation is consistent with episodes that she has had in the past.  She states that she was primarily here because her boss told her to come in.  Patient states that she has a appointment with her psychiatrist in a couple weeks.  Advised the patient to take Benadryl at home if these episodes occur again.   Patient was provided with anticipatory guidance, return precautions, and educational material. Encouraged the patient to return to the emergency department at any time if they begin to experience any new or worsening symptoms.       FINAL CLINICAL IMPRESSION(S) / ED DIAGNOSES   Final diagnoses:  Abnormal eye movements     Rx / DC Orders   ED Discharge Orders     None        Note:  This document was prepared using Dragon voice recognition software and may include unintentional dictation errors.   Teodoro Spray, [place] 05/25/21 1840    Harvest Dark, MD 05/28/21 2059

## 2021-05-25 NOTE — ED Notes (Addendum)
First RN note:  Pt comes into the ED from her employer c/o that her "eyes are stuck looking up".  Pt denies any pain.  Pt states this has happened before in the past. When this RN asks questions to the patient, the patient is able to look at person speaking with her.

## 2021-05-25 NOTE — Discharge Instructions (Addendum)
-  Follow-up with your psychiatrist and primary care provider as discussed -Return to the emergency department at any time if you begin to experience any new or worsening symptoms

## 2021-05-25 NOTE — ED Triage Notes (Signed)
Pt to ED for "eyes being stuck upward". States this has happened before and when seen in ED a few months ago for same was told there is nothing that can be done to fix it. States this episode started about 30 mins ago at work.  Denies pain. Clear speech, alert and oriente.d

## 2021-05-26 ENCOUNTER — Other Ambulatory Visit: Payer: Self-pay

## 2021-05-27 ENCOUNTER — Other Ambulatory Visit: Payer: Self-pay

## 2021-05-28 ENCOUNTER — Other Ambulatory Visit: Payer: Self-pay

## 2021-05-31 ENCOUNTER — Other Ambulatory Visit: Payer: Self-pay

## 2021-06-01 ENCOUNTER — Other Ambulatory Visit: Payer: Self-pay

## 2021-06-01 DIAGNOSIS — F4310 Post-traumatic stress disorder, unspecified: Secondary | ICD-10-CM | POA: Diagnosis not present

## 2021-06-01 MED ORDER — TRAZODONE HCL 100 MG PO TABS
ORAL_TABLET | ORAL | 0 refills | Status: DC
  Filled 2021-06-01: qty 60, 30d supply, fill #0

## 2021-06-04 DIAGNOSIS — F17210 Nicotine dependence, cigarettes, uncomplicated: Secondary | ICD-10-CM | POA: Diagnosis not present

## 2021-06-04 DIAGNOSIS — H5319 Other subjective visual disturbances: Secondary | ICD-10-CM | POA: Diagnosis not present

## 2021-06-04 DIAGNOSIS — F319 Bipolar disorder, unspecified: Secondary | ICD-10-CM | POA: Diagnosis not present

## 2021-06-04 DIAGNOSIS — F1090 Alcohol use, unspecified, uncomplicated: Secondary | ICD-10-CM | POA: Diagnosis not present

## 2021-06-04 DIAGNOSIS — Z79899 Other long term (current) drug therapy: Secondary | ICD-10-CM | POA: Diagnosis not present

## 2021-06-04 DIAGNOSIS — Z888 Allergy status to other drugs, medicaments and biological substances status: Secondary | ICD-10-CM | POA: Diagnosis not present

## 2021-06-04 DIAGNOSIS — G2401 Drug induced subacute dyskinesia: Secondary | ICD-10-CM | POA: Diagnosis not present

## 2021-06-04 DIAGNOSIS — F4310 Post-traumatic stress disorder, unspecified: Secondary | ICD-10-CM | POA: Diagnosis not present

## 2021-06-04 DIAGNOSIS — H538 Other visual disturbances: Secondary | ICD-10-CM | POA: Diagnosis not present

## 2021-06-04 DIAGNOSIS — F603 Borderline personality disorder: Secondary | ICD-10-CM | POA: Diagnosis not present

## 2021-06-05 DIAGNOSIS — H5319 Other subjective visual disturbances: Secondary | ICD-10-CM | POA: Diagnosis not present

## 2021-06-07 ENCOUNTER — Other Ambulatory Visit: Payer: Self-pay

## 2021-06-07 DIAGNOSIS — F411 Generalized anxiety disorder: Secondary | ICD-10-CM | POA: Diagnosis not present

## 2021-06-07 DIAGNOSIS — F331 Major depressive disorder, recurrent, moderate: Secondary | ICD-10-CM | POA: Diagnosis not present

## 2021-06-07 DIAGNOSIS — F250 Schizoaffective disorder, bipolar type: Secondary | ICD-10-CM | POA: Diagnosis not present

## 2021-06-07 MED ORDER — ONDANSETRON 4 MG PO TBDP
ORAL_TABLET | ORAL | 0 refills | Status: DC
  Filled 2021-06-07: qty 10, 4d supply, fill #0

## 2021-06-08 ENCOUNTER — Other Ambulatory Visit: Payer: Self-pay

## 2021-06-08 DIAGNOSIS — F4310 Post-traumatic stress disorder, unspecified: Secondary | ICD-10-CM | POA: Diagnosis not present

## 2021-06-08 MED ORDER — ZIPRASIDONE HCL 20 MG PO CAPS
ORAL_CAPSULE | ORAL | 3 refills | Status: DC
  Filled 2021-06-08: qty 30, 30d supply, fill #0

## 2021-06-08 MED ORDER — SERTRALINE HCL 100 MG PO TABS
ORAL_TABLET | ORAL | 3 refills | Status: DC
  Filled 2021-06-08: qty 45, 30d supply, fill #0

## 2021-06-08 MED ORDER — INGREZZA 60 MG PO CAPS
ORAL_CAPSULE | ORAL | 3 refills | Status: DC
  Filled 2021-06-08: qty 30, 30d supply, fill #0
  Filled 2021-07-20 – 2021-08-12 (×2): qty 30, 30d supply, fill #1

## 2021-06-09 ENCOUNTER — Other Ambulatory Visit: Payer: Self-pay

## 2021-06-09 DIAGNOSIS — N200 Calculus of kidney: Secondary | ICD-10-CM | POA: Insufficient documentation

## 2021-06-09 DIAGNOSIS — N23 Unspecified renal colic: Secondary | ICD-10-CM | POA: Diagnosis not present

## 2021-06-09 DIAGNOSIS — R109 Unspecified abdominal pain: Secondary | ICD-10-CM | POA: Diagnosis not present

## 2021-06-09 DIAGNOSIS — R1031 Right lower quadrant pain: Secondary | ICD-10-CM | POA: Diagnosis present

## 2021-06-09 LAB — CBC WITH DIFFERENTIAL/PLATELET
Abs Immature Granulocytes: 0.02 10*3/uL (ref 0.00–0.07)
Basophils Absolute: 0.1 10*3/uL (ref 0.0–0.1)
Basophils Relative: 1 %
Eosinophils Absolute: 0 10*3/uL (ref 0.0–0.5)
Eosinophils Relative: 0 %
HCT: 38.1 % (ref 36.0–46.0)
Hemoglobin: 12.3 g/dL (ref 12.0–15.0)
Immature Granulocytes: 0 %
Lymphocytes Relative: 20 %
Lymphs Abs: 1.9 10*3/uL (ref 0.7–4.0)
MCH: 27.2 pg (ref 26.0–34.0)
MCHC: 32.3 g/dL (ref 30.0–36.0)
MCV: 84.3 fL (ref 80.0–100.0)
Monocytes Absolute: 0.9 10*3/uL (ref 0.1–1.0)
Monocytes Relative: 10 %
Neutro Abs: 6.6 10*3/uL (ref 1.7–7.7)
Neutrophils Relative %: 69 %
Platelets: 288 10*3/uL (ref 150–400)
RBC: 4.52 MIL/uL (ref 3.87–5.11)
RDW: 14 % (ref 11.5–15.5)
WBC: 9.5 10*3/uL (ref 4.0–10.5)
nRBC: 0 % (ref 0.0–0.2)

## 2021-06-09 LAB — URINALYSIS, ROUTINE W REFLEX MICROSCOPIC
Bilirubin Urine: NEGATIVE
Glucose, UA: NEGATIVE mg/dL
Ketones, ur: NEGATIVE mg/dL
Nitrite: NEGATIVE
Protein, ur: NEGATIVE mg/dL
Specific Gravity, Urine: 1.01 (ref 1.005–1.030)
pH: 6 (ref 5.0–8.0)

## 2021-06-09 LAB — COMPREHENSIVE METABOLIC PANEL
ALT: 16 U/L (ref 0–44)
AST: 20 U/L (ref 15–41)
Albumin: 4.4 g/dL (ref 3.5–5.0)
Alkaline Phosphatase: 68 U/L (ref 38–126)
Anion gap: 11 (ref 5–15)
BUN: 10 mg/dL (ref 6–20)
CO2: 24 mmol/L (ref 22–32)
Calcium: 8.9 mg/dL (ref 8.9–10.3)
Chloride: 104 mmol/L (ref 98–111)
Creatinine, Ser: 0.98 mg/dL (ref 0.44–1.00)
GFR, Estimated: >60 mL/min (ref >60)
Glucose, Bld: 114 mg/dL — ABNORMAL HIGH (ref 70–99)
Potassium: 3.3 mmol/L — ABNORMAL LOW (ref 3.5–5.1)
Sodium: 139 mmol/L (ref 135–145)
Total Bilirubin: 0.5 mg/dL (ref 0.3–1.2)
Total Protein: 7.3 g/dL (ref 6.5–8.1)

## 2021-06-09 LAB — LIPASE, BLOOD: Lipase: 33 U/L (ref 11–51)

## 2021-06-09 LAB — POC URINE PREG, ED: Preg Test, Ur: NEGATIVE

## 2021-06-09 MED ORDER — ONDANSETRON 4 MG PO TBDP
4.0000 mg | ORAL_TABLET | Freq: Once | ORAL | Status: AC
  Administered 2021-06-09: 4 mg via ORAL
  Filled 2021-06-09: qty 1

## 2021-06-09 MED ORDER — SODIUM CHLORIDE 0.9 % IV BOLUS
500.0000 mL | Freq: Once | INTRAVENOUS | Status: AC
  Administered 2021-06-09: 500 mL via INTRAVENOUS

## 2021-06-09 MED ORDER — ONDANSETRON HCL 4 MG/2ML IJ SOLN
4.0000 mg | Freq: Once | INTRAMUSCULAR | Status: AC
  Administered 2021-06-09: 4 mg via INTRAVENOUS
  Filled 2021-06-09: qty 2

## 2021-06-09 NOTE — ED Notes (Signed)
Pt actively vomiting - including the zofran medication. Discussed the pt with Dr. Fanny Bien - recommended IV fluids ( ) bolus; IV zofran; and Poct preg.   Pt to obtain urine specimen at this time.

## 2021-06-09 NOTE — ED Triage Notes (Signed)
Pt presents via POV with complaints of RLQ pain that started 1 hour ago. She endorses N/V - with one episode of vomiting due to the pain PTA. Denies CP or SOB.

## 2021-06-10 ENCOUNTER — Other Ambulatory Visit: Payer: Self-pay

## 2021-06-10 ENCOUNTER — Emergency Department: Payer: 59

## 2021-06-10 ENCOUNTER — Emergency Department
Admission: EM | Admit: 2021-06-10 | Discharge: 2021-06-10 | Disposition: A | Discharge status: Home / Self Care | Payer: 59 | Attending: Emergency Medicine | Admitting: Emergency Medicine

## 2021-06-10 DIAGNOSIS — N23 Unspecified renal colic: Secondary | ICD-10-CM | POA: Diagnosis not present

## 2021-06-10 DIAGNOSIS — R109 Unspecified abdominal pain: Secondary | ICD-10-CM

## 2021-06-10 DIAGNOSIS — N200 Calculus of kidney: Secondary | ICD-10-CM | POA: Diagnosis not present

## 2021-06-10 MED ORDER — IOHEXOL 300 MG/ML  SOLN
100.0000 mL | Freq: Once | INTRAMUSCULAR | Status: AC | PRN
  Administered 2021-06-10: 100 mL via INTRAVENOUS
  Filled 2021-06-10: qty 100

## 2021-06-10 MED ORDER — OXYCODONE HCL 5 MG PO TABS
5.0000 mg | ORAL_TABLET | Freq: Once | ORAL | Status: AC
  Administered 2021-06-10: 5 mg via ORAL
  Filled 2021-06-10: qty 1

## 2021-06-10 MED ORDER — KETOROLAC TROMETHAMINE 15 MG/ML IJ SOLN
15.0000 mg | Freq: Once | INTRAMUSCULAR | Status: AC
Start: 2021-06-10 — End: 2021-06-10
  Administered 2021-06-10: 15 mg via INTRAVENOUS
  Filled 2021-06-10: qty 1

## 2021-06-10 MED ORDER — KETOROLAC TROMETHAMINE 15 MG/ML IJ SOLN
15.0000 mg | Freq: Once | INTRAMUSCULAR | Status: AC
  Administered 2021-06-10: 15 mg via INTRAVENOUS
  Filled 2021-06-10: qty 1

## 2021-06-10 MED ORDER — SODIUM CHLORIDE 0.9 % IV BOLUS
1000.0000 mL | Freq: Once | INTRAVENOUS | Status: AC
  Administered 2021-06-10: 1000 mL via INTRAVENOUS

## 2021-06-10 MED ORDER — TAMSULOSIN HCL 0.4 MG PO CAPS
0.4000 mg | ORAL_CAPSULE | Freq: Every day | ORAL | 0 refills | Status: DC
  Filled 2021-06-10: qty 7, 7d supply, fill #0

## 2021-06-10 MED ORDER — FENTANYL CITRATE PF 50 MCG/ML IJ SOSY: 50.0000 ug | PREFILLED_SYRINGE | Freq: Once | INTRAMUSCULAR | Status: DC

## 2021-06-10 MED ORDER — ONDANSETRON 4 MG PO TBDP
4.0000 mg | ORAL_TABLET | Freq: Three times a day (TID) | ORAL | 0 refills | Status: DC | PRN
  Filled 2021-06-10: qty 20, 7d supply, fill #0

## 2021-06-10 MED ORDER — TAMSULOSIN HCL 0.4 MG PO CAPS
0.4000 mg | ORAL_CAPSULE | Freq: Once | ORAL | Status: AC
  Administered 2021-06-10: 0.4 mg via ORAL
  Filled 2021-06-10: qty 1

## 2021-06-10 MED ORDER — OXYCODONE-ACETAMINOPHEN 5-325 MG PO TABS
1.0000 | ORAL_TABLET | ORAL | 0 refills | Status: AC | PRN
  Filled 2021-06-10: qty 20, 4d supply, fill #0

## 2021-06-10 MED ORDER — ACETAMINOPHEN 500 MG PO TABS
1000.0000 mg | ORAL_TABLET | Freq: Once | ORAL | Status: AC
  Administered 2021-06-10: 1000 mg via ORAL
  Filled 2021-06-10: qty 2

## 2021-06-10 MED ORDER — IBUPROFEN 800 MG PO TABS
800.0000 mg | ORAL_TABLET | Freq: Three times a day (TID) | ORAL | 0 refills | Status: DC | PRN
  Filled 2021-06-10: qty 30, 10d supply, fill #0

## 2021-06-10 NOTE — Discharge Instructions (Signed)
You have been seen in the Emergency Department (ED)  Today and was diagnosed with kidney stones. While the stone is traveling through the ureter, which is the tube that carries urine from the kidney to the bladder, you will probably feel pain. The pain may be mild or very severe. You may also have some blood in your urine. As soon as the stone reaches the bladder, any intense pain should go away. If a stone is too large to pass on its own, you may need a medical procedure to help you pass the stone.   As we have discussed, please drink plenty of fluids and use a urinary strainer to attempt to capture the stone.  Please make a follow up appointment with Urology in the next week by calling the number below and bring the stone with you.  Pain control: Take ibuprofen 600mg every 6 hours for the pain. If the pain is not well controlled with ibuprofen you may take one percocet every 4 hours. Do not take tylenol while taking percocet. Please also take your prescribed flomax daily.   Follow-up with your doctor or return to the ER in 12-24 hours if your pain is not well controlled, if you develop pain or burning with urination, or if you develop a fever. Otherwise follow up in 3-5 days with your doctor.  When should you call for help?  Call your doctor now or seek immediate medical care if:  You cannot keep down fluids.  Your pain gets worse.  You have a fever or chills.  You have new or worse pain in your back just below your rib cage (the flank area).  You have new or more blood in your urine. You have pain or burning with urination You are unable to urinate You have abdominal pain  Watch closely for changes in your health, and be sure to contact your doctor if:  You do not get better as expected  How can you care for yourself at home?  Drink plenty of fluids, enough so that your urine is light yellow or clear like water. If you have kidney, heart, or liver disease and have to limit fluids, talk with  your doctor before you increase the amount of fluids you drink.  Take pain medicines exactly as directed. Call your doctor if you think you are having a problem with your medicine.  If the doctor gave you a prescription medicine for pain, take it as prescribed.  If you are not taking a prescription pain medicine, ask your doctor if you can take an over-the-counter medicine. Read and follow all instructions on the label. Your doctor may ask you to strain your urine so that you can collect your kidney stone when it passes. You can use a kitchen strainer or a tea strainer to catch the stone. Store it in a plastic bag until you see your doctor again.  Preventing future kidney stones  Some changes in your diet may help prevent kidney stones. Depending on the cause of your stones, your doctor may recommend that you:  Drink plenty of fluids, enough so that your urine is light yellow or clear like water. If you have kidney, heart, or liver disease and have to limit fluids, talk with your doctor before you increase the amount of fluids you drink.  Limit coffee, tea, and alcohol. Also avoid grapefruit juice.  Do not take more than the recommended daily dose of vitamins C and D.  Avoid antacids such as Gaviscon,   Maalox, Mylanta, or Tums.  Limit the amount of salt (sodium) in your diet.  Eat a balanced diet that is not too high in protein.  Limit foods that are high in a substance called oxalate, which can cause kidney stones. These foods include dark green vegetables, rhubarb, chocolate, wheat bran, nuts, cranberries, and beans.    

## 2021-06-10 NOTE — ED Provider Notes (Signed)
[facility] Provider Note    Event Date/Time   First MD Initiated Contact with Patient 06/10/21 [phone]     (approximate)   History   Abdominal Pain   HPI  Sue Jones is a 26 y.o. female with a history of anxiety, bipolar disorder who presents for evaluation of abdominal pain.  Patient reports that she started having sudden onset of sharp cramping initially right upper quadrant which moved down to the right lower quadrant abdominal pain.  The pain has been constant since it started an hour prior to arrival.  She has had nausea and a couple episodes of nonbloody nonbilious emesis.  No chest pain, no shortness of breath, no cough or congestion, no fever or chills, no dysuria or hematuria.  No prior abdominal surgeries.  The pain is moderate in intensity.     Past Medical History:  Diagnosis Date   Anxiety    Bipolar 1 disorder ([place])    Depression     No past surgical history on file.   Physical Exam   Triage Vital Signs: ED Triage Vitals  Enc Vitals Group     BP 06/09/21 2117 (!) 140/94     Pulse Rate 06/09/21 2117 91     Resp 06/09/21 2117 18     Temp 06/09/21 2117 98.4 F (36.9 C)     Temp Source 06/09/21 2117 Oral     SpO2 06/09/21 2117 100 %     Weight 06/09/21 2120 160 lb (72.6 kg)     Height 06/09/21 2120 5\' 7"  (1.702 m)     Head Circumference --      Peak Flow --      Pain Score 06/09/21 2120 10     Pain Loc --      Pain Edu? --      Excl. in [place]? --     Most recent vital signs: Vitals:   06/10/21 0049 06/10/21 0229  BP: (!) 138/112 136/87  Pulse: 89 86  Resp: 20 18  Temp:    SpO2: 99% 100%     Constitutional: Alert and oriented. Well appearing and in no apparent distress. HEENT:      Head: Normocephalic and atraumatic.         Eyes: Conjunctivae are normal. Sclera is non-icteric.       Mouth/Throat: Mucous membranes are moist.       Neck: Supple with no signs of meningismus. Cardiovascular: Regular rate and rhythm.  No murmurs, gallops, or rubs. 2+ symmetrical distal pulses are present in all extremities.  Respiratory: Normal respiratory effort. Lungs are clear to auscultation bilaterally.  Gastrointestinal: Soft, mildly tender to palpation in the right upper quadrant with negative Murphy sign, and non distended with positive bowel sounds. No rebound or guarding. Genitourinary: No CVA tenderness. Musculoskeletal:  No edema, cyanosis, or erythema of extremities. Neurologic: Normal speech and language. Face is symmetric. Moving all extremities. No gross focal neurologic deficits are appreciated. Skin: Skin is warm, dry and intact. No rash noted. Psychiatric: Mood and affect are normal. Speech and behavior are normal.  ED Results / Procedures / Treatments   Labs (all labs ordered are listed, but only abnormal results are displayed) Labs Reviewed  COMPREHENSIVE METABOLIC PANEL - Abnormal; Notable for the following components:      Result Value   Potassium 3.3 (*)    Glucose, Bld 114 (*)    All other components within normal limits  URINALYSIS, ROUTINE W REFLEX MICROSCOPIC -  Abnormal; Notable for the following components:   Color, Urine YELLOW (*)    APPearance HAZY (*)    Hgb urine dipstick SMALL (*)    Leukocytes,Ua TRACE (*)    Bacteria, UA RARE (*)    All other components within normal limits  CBC WITH DIFFERENTIAL/PLATELET  LIPASE, BLOOD  POC URINE PREG, ED     EKG  none   RADIOLOGY I, Rudene Re, attending MD, have personally viewed and interpreted the images obtained during this visit as below:  RUQ [place]: negative  CT showing R distal ureteral stone   ___________________________________________________ Interpretation by Radiologist:  CT ABDOMEN PELVIS W CONTRAST  Result Date: 06/10/2021 CLINICAL DATA:  Abdominal pain. EXAM: CT ABDOMEN AND PELVIS WITH CONTRAST TECHNIQUE: Multidetector CT imaging of the abdomen and pelvis was performed using the standard protocol following  bolus administration of intravenous contrast. RADIATION DOSE REDUCTION: This exam was performed according to the departmental dose-optimization program which includes automated exposure control, adjustment of the mA and/or kV according to patient size and/or use of iterative reconstruction technique. CONTRAST:  143mL OMNIPAQUE IOHEXOL 300 MG/ML  SOLN COMPARISON:  March 10, 2015 FINDINGS: Lower chest: No acute abnormality. Hepatobiliary: No focal liver abnormality is seen. No gallstones, gallbladder wall thickening, or biliary dilatation. Pancreas: Unremarkable. No pancreatic ductal dilatation or surrounding inflammatory changes. Spleen: A subcentimeter cystic appearing areas seen within the posterior aspect of the spleen. Adrenals/Urinary Tract: Adrenal glands are unremarkable. Kidneys are normal in size, without focal lesions. A 3 mm obstructing renal calculus is seen within the distal right ureter, near the right UVJ. There is associated right-sided hydronephrosis and hydroureter with delayed renal cortical enhancement of the right kidney. Bladder is unremarkable. Stomach/Bowel: Stomach is within normal limits. Appendix appears normal. No evidence of bowel wall thickening, distention, or inflammatory changes. Vascular/Lymphatic: No significant vascular findings are present. No enlarged abdominal or pelvic lymph nodes. Reproductive: Uterus and bilateral adnexa are unremarkable. Other: No abdominal wall hernia or abnormality. No abdominopelvic ascites. Musculoskeletal: No acute or significant osseous findings. IMPRESSION: 3 mm obstructing renal calculus within the distal right ureter. Electronically Signed   By: Virgina Norfolk M.D.   On: 06/10/2021 03:37   US ABDOMEN LIMITED RUQ (LIVER/GB)  Result Date: 06/10/2021 CLINICAL DATA:  Abdominal pain EXAM: ULTRASOUND ABDOMEN LIMITED RIGHT UPPER QUADRANT COMPARISON:  None. FINDINGS: Gallbladder: No gallstones or wall thickening visualized. No sonographic Murphy  sign noted by sonographer. Common bile duct: Diameter: 3.3 mm. Liver: No focal lesion identified. Within normal limits in parenchymal echogenicity. Portal vein is patent on color Doppler imaging with normal direction of blood flow towards the liver. Other: None. IMPRESSION: No acute abnormality noted. Electronically Signed   By: Inez Catalina M.D.   On: 06/10/2021 02:48      PROCEDURES:  Critical Care performed: No  Procedures    IMPRESSION / MDM / ASSESSMENT AND PLAN / ED COURSE  I reviewed the triage vital signs and the nursing notes.  26 y.o. female with a history of anxiety, bipolar disorder who presents for evaluation of abdominal pain.  Patient with 5 hours of constant right-sided sharp abdominal pain with nausea and a few episodes of nonbloody nonbilious emesis.  Patient is well-appearing in no distress, abdomen is soft and nondistended with mild right upper quadrant tenderness, no right lower quadrant tenderness, negative Murphy sign, no rebound or guarding.  Ddx: Gallbladder pathology versus appendicitis versus ovarian cyst versus ovarian torsion versus PID versus pregnancy versus ectopic pregnancy versus pancreatitis versus kidney  stone versus UTI   Plan: CBC, CMP, lipase, pregnancy test, urinalysis.  We will give IV Toradol, IV fluids, and IV Zofran.  We will send patient for right upper quadrant ultrasound.   MEDICATIONS GIVEN IN ED: Medications  ondansetron (ZOFRAN-ODT) disintegrating tablet 4 mg (4 mg Oral Given 06/09/21 2122)  sodium chloride 0.9 % bolus 500 mL (0 mLs Intravenous Stopped 06/09/21 2210)  ondansetron (ZOFRAN) injection 4 mg (4 mg Intravenous Given 06/09/21 2139)  ketorolac (TORADOL) 15 MG/ML injection 15 mg (15 mg Intravenous Given 06/10/21 0045)  sodium chloride 0.9 % bolus 1,000 mL (0 mLs Intravenous Stopped 06/10/21 0228)  iohexol (OMNIPAQUE) 300 MG/ML solution 100 mL (100 mLs Intravenous Contrast Given 06/10/21 0325)  acetaminophen (TYLENOL) tablet 1,000 mg  (1,000 mg Oral Given 06/10/21 0412)  tamsulosin (FLOMAX) capsule 0.4 mg (0.4 mg Oral Given 06/10/21 0413)  oxyCODONE (Oxy IR/ROXICODONE) immediate release tablet 5 mg (5 mg Oral Given 06/10/21 0413)  ketorolac (TORADOL) 15 MG/ML injection 15 mg (15 mg Intravenous Given 06/10/21 0412)     ED COURSE: Pregnancy test negative.  CBC with no leukocytosis.  Chemistry panel with no significant electrolyte derangements, normal LFTs and lipase.  UA with some hemoglobin but no signs of UTI.  Ultrasound showed normal gallbladder with no signs of acute cholecystitis.  CT abdomen pelvis was done showing a 3 mm distal ureteral stone.  Patient was medicated with IV Toradol, Flomax, oxycodone, Tylenol, Zofran.  Her pain is 2 out of 10.  Admission was considered but with no signs of AKI, overlying infection, and pain well controlled with oral medication and felt that is appropriate for patient to go home.  Referral to urology was done.  Discussed symptom control at home and indications for return to the emergency room   Consults: None   EMR reviewed including last visit with her primary care doctor from January 2023 for PTSD    FINAL CLINICAL IMPRESSION(S) / ED DIAGNOSES   Final diagnoses:  Abdominal pain  Renal colic  Kidney stone     Rx / DC Orders   ED Discharge Orders          Ordered    ibuprofen (ADVIL) 800 MG tablet  Every 8 hours PRN        06/10/21 0444    tamsulosin (FLOMAX) 0.4 MG CAPS capsule  Daily        06/10/21 0444    ondansetron (ZOFRAN-ODT) 4 MG disintegrating tablet  Every 8 hours PRN        06/10/21 0444    oxyCODONE-acetaminophen (PERCOCET) 5-325 MG tablet  Every 4 hours PRN        06/10/21 0444             Note:  This document was prepared using Dragon voice recognition software and may include unintentional dictation errors.   Please note:  Patient was evaluated in Emergency Department today for the symptoms described in the history of present illness. Patient was  evaluated in the context of the global COVID-19 pandemic, which necessitated consideration that the patient might be at risk for infection with the SARS-CoV-2 virus that causes COVID-19. Institutional protocols and algorithms that pertain to the evaluation of patients at risk for COVID-19 are in a state of rapid change based on information released by regulatory bodies including the CDC and federal and state organizations. These policies and algorithms were followed during the patient's care in the ED.  Some ED evaluations and interventions may be delayed as a  result of limited staffing during the pandemic.       Alfred Levins, [place], MD 06/10/21 [phone]

## 2021-06-10 NOTE — ED Notes (Signed)
U/S in room at this time. 

## 2021-06-15 DIAGNOSIS — F4310 Post-traumatic stress disorder, unspecified: Secondary | ICD-10-CM | POA: Diagnosis not present

## 2021-06-17 ENCOUNTER — Other Ambulatory Visit: Payer: Self-pay

## 2021-06-17 DIAGNOSIS — R52 Pain, unspecified: Secondary | ICD-10-CM | POA: Diagnosis not present

## 2021-06-17 DIAGNOSIS — N200 Calculus of kidney: Secondary | ICD-10-CM | POA: Diagnosis not present

## 2021-06-17 MED ORDER — TAMSULOSIN HCL 0.4 MG PO CAPS
ORAL_CAPSULE | ORAL | 0 refills | Status: DC
  Filled 2021-06-17: qty 7, 7d supply, fill #0

## 2021-06-22 DIAGNOSIS — F4310 Post-traumatic stress disorder, unspecified: Secondary | ICD-10-CM | POA: Diagnosis not present

## 2021-07-06 DIAGNOSIS — F4310 Post-traumatic stress disorder, unspecified: Secondary | ICD-10-CM | POA: Diagnosis not present

## 2021-07-20 ENCOUNTER — Other Ambulatory Visit: Payer: Self-pay

## 2021-07-20 DIAGNOSIS — F4310 Post-traumatic stress disorder, unspecified: Secondary | ICD-10-CM | POA: Diagnosis not present

## 2021-07-21 ENCOUNTER — Other Ambulatory Visit: Payer: Self-pay

## 2021-07-22 ENCOUNTER — Other Ambulatory Visit: Payer: Self-pay

## 2021-07-26 ENCOUNTER — Other Ambulatory Visit: Payer: Self-pay

## 2021-07-27 DIAGNOSIS — F4310 Post-traumatic stress disorder, unspecified: Secondary | ICD-10-CM | POA: Diagnosis not present

## 2021-07-28 ENCOUNTER — Other Ambulatory Visit: Payer: Self-pay

## 2021-07-29 ENCOUNTER — Other Ambulatory Visit: Payer: Self-pay

## 2021-08-03 ENCOUNTER — Other Ambulatory Visit: Payer: Self-pay

## 2021-08-05 ENCOUNTER — Other Ambulatory Visit: Payer: Self-pay

## 2021-08-06 ENCOUNTER — Other Ambulatory Visit: Payer: Self-pay

## 2021-08-10 DIAGNOSIS — F4310 Post-traumatic stress disorder, unspecified: Secondary | ICD-10-CM | POA: Diagnosis not present

## 2021-08-12 ENCOUNTER — Other Ambulatory Visit: Payer: Self-pay

## 2021-08-17 DIAGNOSIS — F4310 Post-traumatic stress disorder, unspecified: Secondary | ICD-10-CM | POA: Diagnosis not present

## 2021-08-26 DIAGNOSIS — F4310 Post-traumatic stress disorder, unspecified: Secondary | ICD-10-CM | POA: Diagnosis not present

## 2021-08-31 DIAGNOSIS — F4310 Post-traumatic stress disorder, unspecified: Secondary | ICD-10-CM | POA: Diagnosis not present

## 2021-09-06 ENCOUNTER — Other Ambulatory Visit: Payer: Self-pay

## 2021-09-06 DIAGNOSIS — H00014 Hordeolum externum left upper eyelid: Secondary | ICD-10-CM | POA: Diagnosis not present

## 2021-09-06 MED ORDER — NEOMYCIN-POLYMYXIN-DEXAMETH 3.5-10000-0.1 OP SUSP
OPHTHALMIC | 0 refills | Status: DC
  Filled 2021-09-06: qty 5, 7d supply, fill #0

## 2021-09-07 ENCOUNTER — Other Ambulatory Visit: Payer: Self-pay

## 2021-09-07 DIAGNOSIS — F4310 Post-traumatic stress disorder, unspecified: Secondary | ICD-10-CM | POA: Diagnosis not present

## 2021-09-14 DIAGNOSIS — F4310 Post-traumatic stress disorder, unspecified: Secondary | ICD-10-CM | POA: Diagnosis not present

## 2021-09-21 DIAGNOSIS — F4310 Post-traumatic stress disorder, unspecified: Secondary | ICD-10-CM | POA: Diagnosis not present

## 2021-10-26 DIAGNOSIS — F4310 Post-traumatic stress disorder, unspecified: Secondary | ICD-10-CM | POA: Diagnosis not present

## 2021-11-09 DIAGNOSIS — F4310 Post-traumatic stress disorder, unspecified: Secondary | ICD-10-CM | POA: Diagnosis not present

## 2021-11-18 DIAGNOSIS — Z87820 Personal history of traumatic brain injury: Secondary | ICD-10-CM | POA: Insufficient documentation

## 2021-11-18 DIAGNOSIS — H518 Other specified disorders of binocular movement: Secondary | ICD-10-CM | POA: Diagnosis not present

## 2021-12-07 DIAGNOSIS — F4310 Post-traumatic stress disorder, unspecified: Secondary | ICD-10-CM | POA: Diagnosis not present

## 2021-12-21 DIAGNOSIS — F4310 Post-traumatic stress disorder, unspecified: Secondary | ICD-10-CM | POA: Diagnosis not present

## 2022-01-04 DIAGNOSIS — F4310 Post-traumatic stress disorder, unspecified: Secondary | ICD-10-CM | POA: Diagnosis not present

## 2022-01-18 DIAGNOSIS — F4310 Post-traumatic stress disorder, unspecified: Secondary | ICD-10-CM | POA: Diagnosis not present

## 2022-02-01 DIAGNOSIS — F4310 Post-traumatic stress disorder, unspecified: Secondary | ICD-10-CM | POA: Diagnosis not present

## 2022-02-17 IMAGING — CR DG CHEST 2V
1 series · 2 of 2 positions shown · non-contrast
Comparison: 04/01/2018

CLINICAL DATA: Right rib and sternal pain

EXAM:
CHEST - 2 VIEW

[Series 1: dg chest 2 view · 0.14mm/px · 2 of 2 slices shown]
[im 1/2]
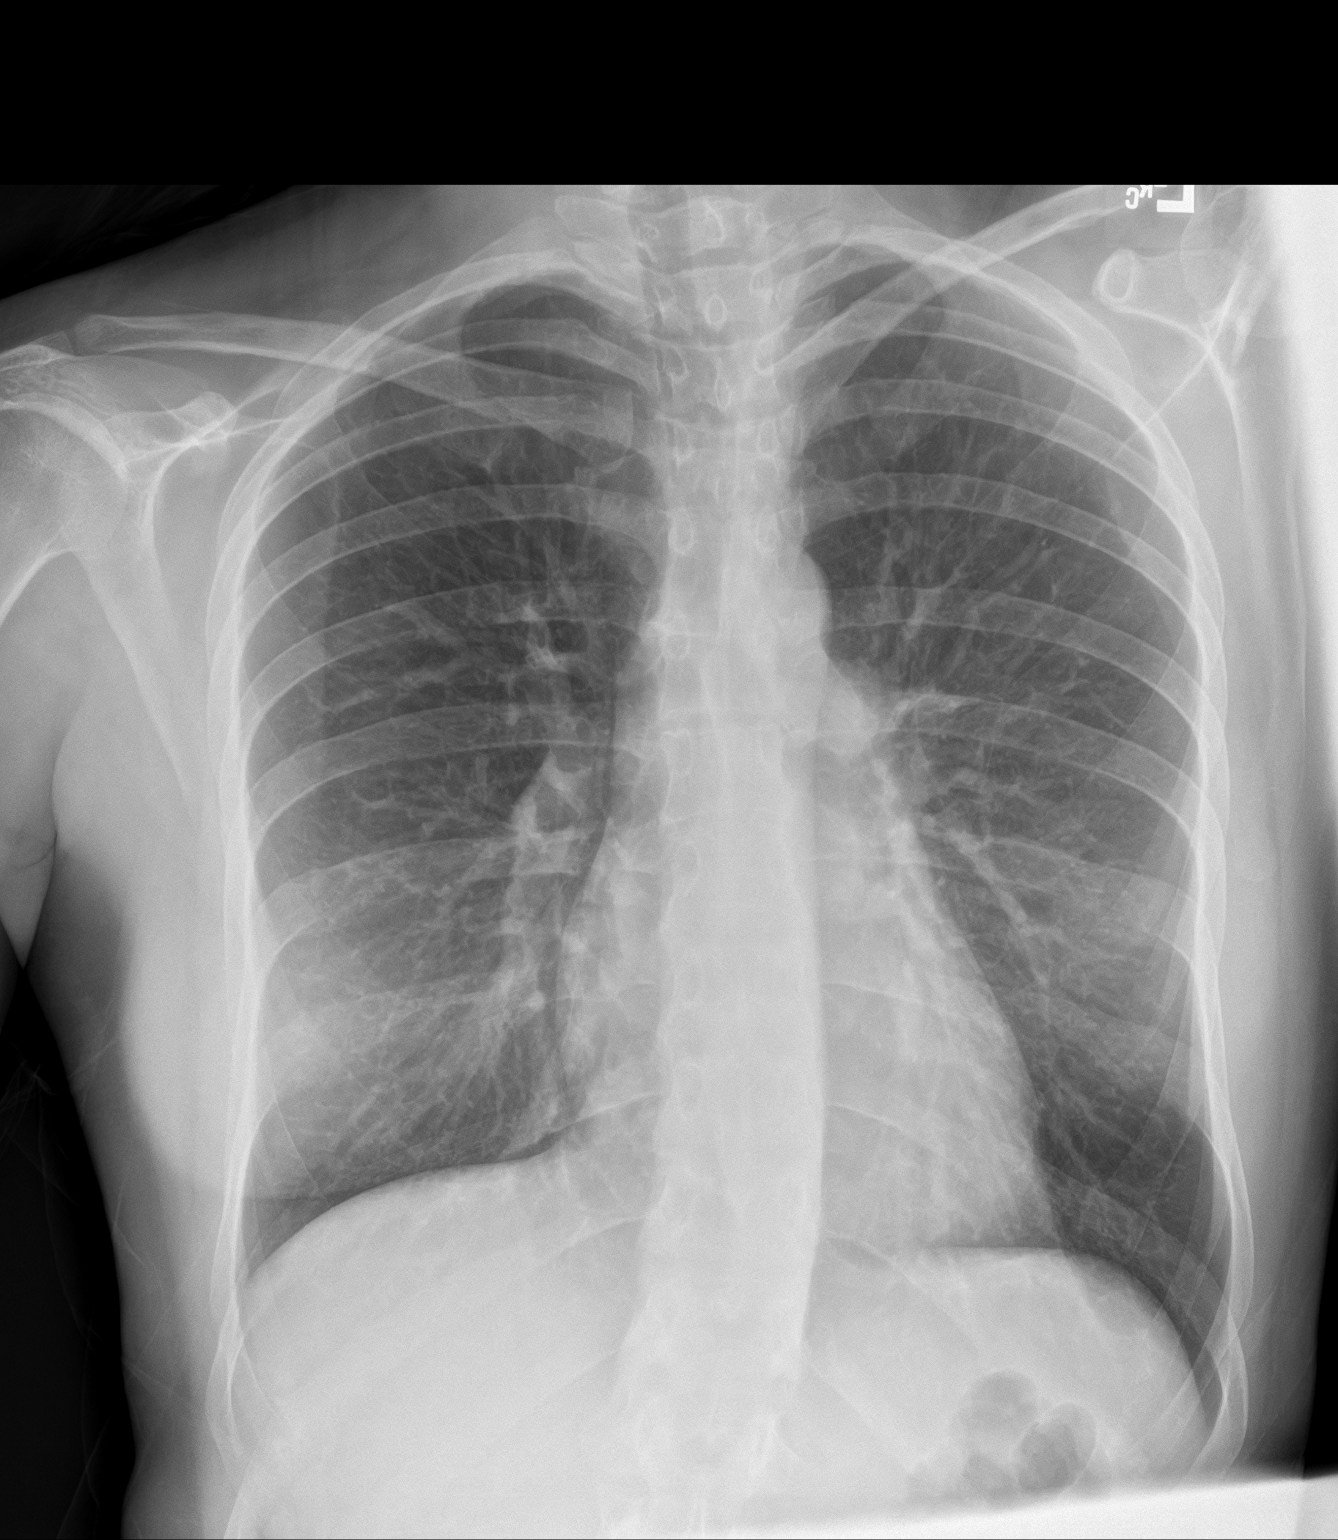
[im 2/2]
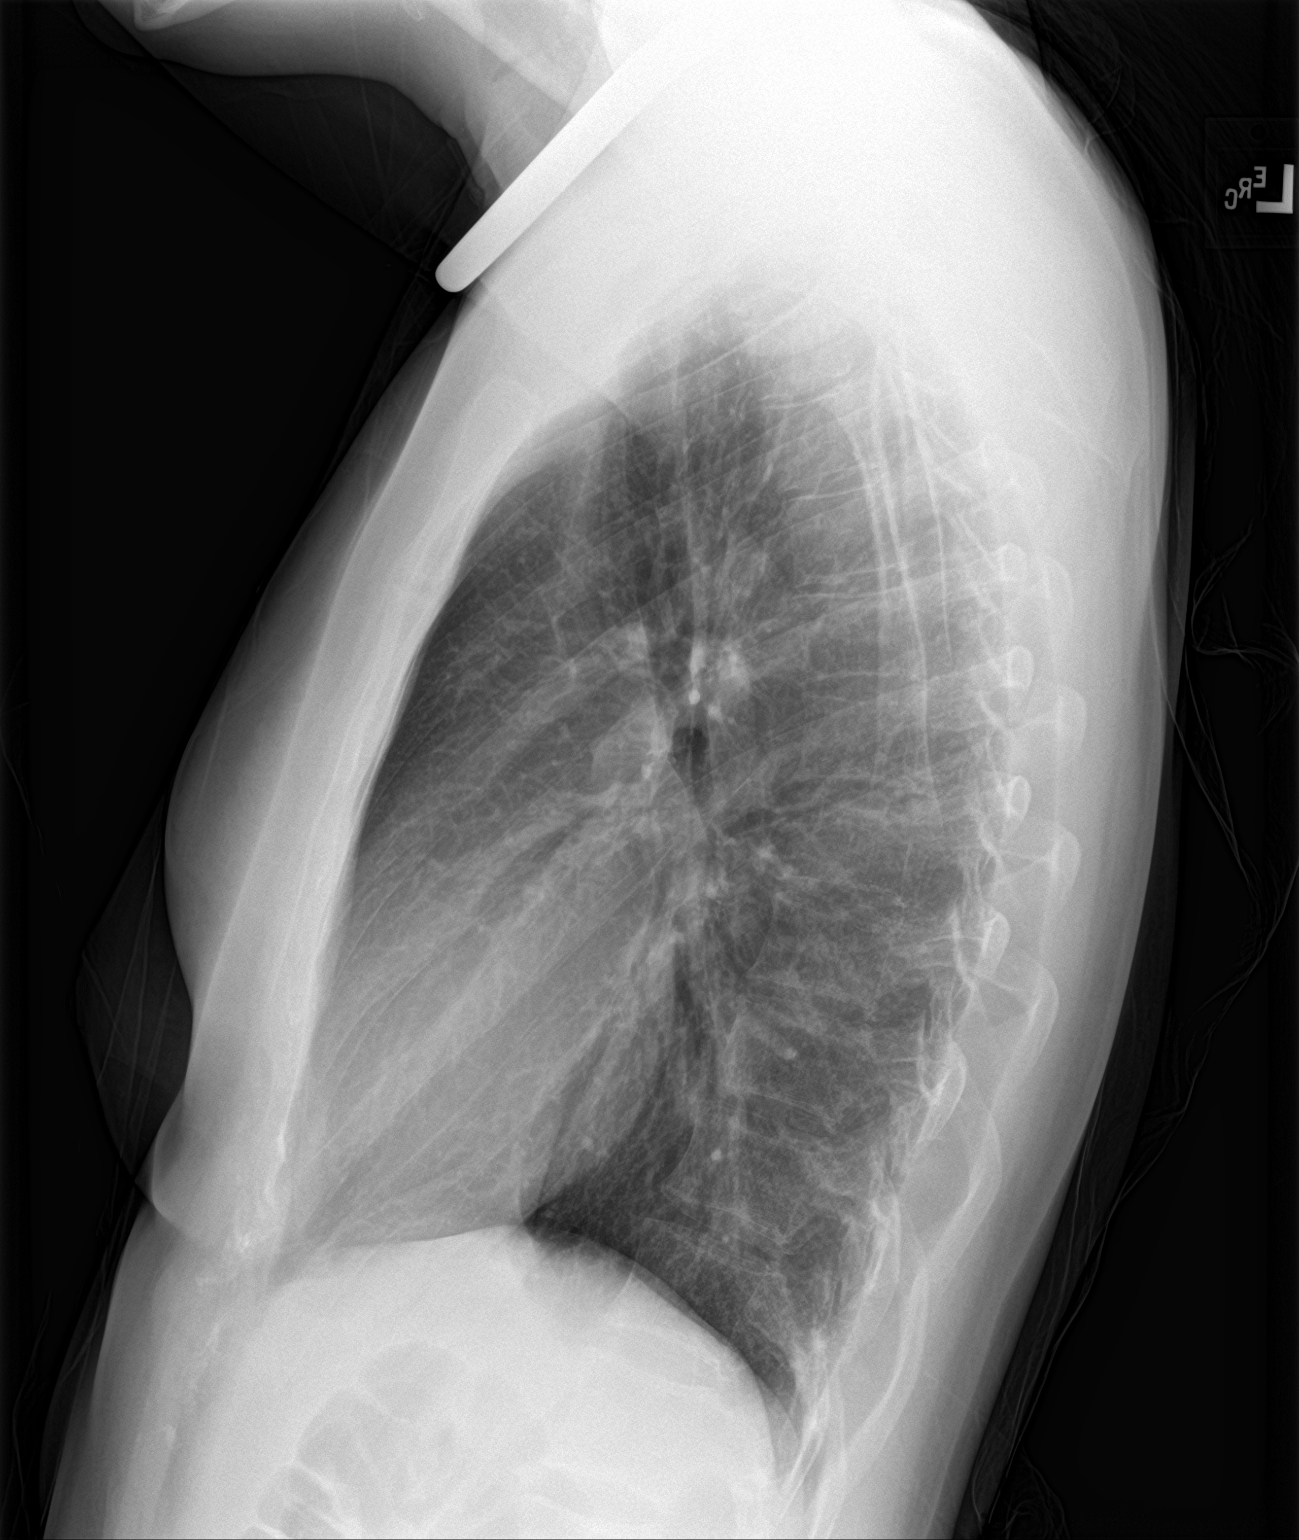

[2 of 2 positions shown; findings below may reference images not displayed]

FINDINGS: The heart size and mediastinal contours are within normal limits.
Both lungs are clear. The visualized skeletal structures are
unremarkable.
IMPRESSION: No active cardiopulmonary disease.

## 2022-03-04 DIAGNOSIS — F4310 Post-traumatic stress disorder, unspecified: Secondary | ICD-10-CM | POA: Diagnosis not present

## 2022-03-15 DIAGNOSIS — F4310 Post-traumatic stress disorder, unspecified: Secondary | ICD-10-CM | POA: Diagnosis not present

## 2022-03-29 DIAGNOSIS — F4310 Post-traumatic stress disorder, unspecified: Secondary | ICD-10-CM | POA: Diagnosis not present

## 2022-04-26 DIAGNOSIS — F4310 Post-traumatic stress disorder, unspecified: Secondary | ICD-10-CM | POA: Diagnosis not present

## 2022-05-18 DIAGNOSIS — F4310 Post-traumatic stress disorder, unspecified: Secondary | ICD-10-CM | POA: Diagnosis not present

## 2022-06-07 DIAGNOSIS — F4310 Post-traumatic stress disorder, unspecified: Secondary | ICD-10-CM | POA: Diagnosis not present

## 2022-06-21 DIAGNOSIS — F4310 Post-traumatic stress disorder, unspecified: Secondary | ICD-10-CM | POA: Diagnosis not present

## 2022-07-13 DIAGNOSIS — F4310 Post-traumatic stress disorder, unspecified: Secondary | ICD-10-CM | POA: Diagnosis not present

## 2022-07-16 DIAGNOSIS — S63502A Unspecified sprain of left wrist, initial encounter: Secondary | ICD-10-CM | POA: Diagnosis not present

## 2022-07-26 DIAGNOSIS — M654 Radial styloid tenosynovitis [de Quervain]: Secondary | ICD-10-CM | POA: Diagnosis not present

## 2022-08-02 DIAGNOSIS — F4310 Post-traumatic stress disorder, unspecified: Secondary | ICD-10-CM | POA: Diagnosis not present

## 2022-08-16 DIAGNOSIS — F4310 Post-traumatic stress disorder, unspecified: Secondary | ICD-10-CM | POA: Diagnosis not present

## 2022-08-30 DIAGNOSIS — F4310 Post-traumatic stress disorder, unspecified: Secondary | ICD-10-CM | POA: Diagnosis not present

## 2022-09-13 DIAGNOSIS — F4310 Post-traumatic stress disorder, unspecified: Secondary | ICD-10-CM | POA: Diagnosis not present

## 2022-09-16 IMAGING — CT CT HEAD W/O CM
3 series · 15 of 47 positions shown, 18 images · non-contrast
Comparison: 01/01/2016

CLINICAL DATA: Migraine-type headache.

EXAM:
CT HEAD WITHOUT CONTRAST
TECHNIQUE: Contiguous axial images were obtained from the base of the skull
through the vertex without intravenous contrast.

[Series 2: head wo · axial · 0.43mm/px · z∈[-155,-30]mm · 9 of 30 slices shown, 12 images]
[im 3/30  brain]
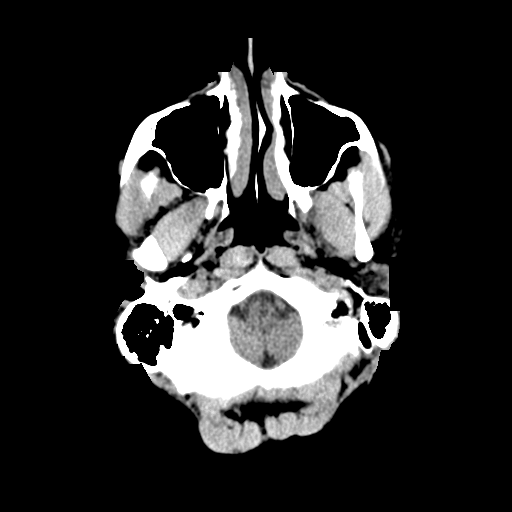
[im 3/30  bone]
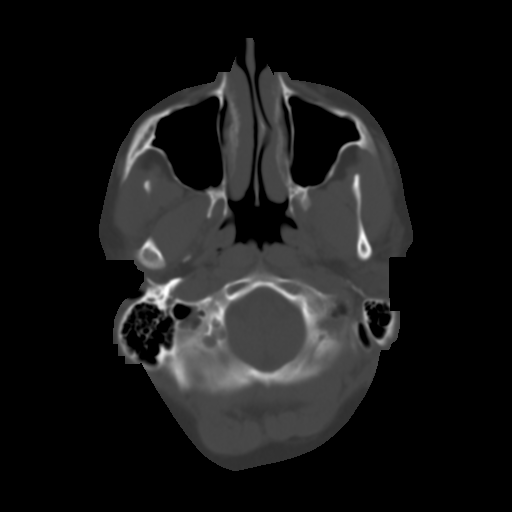
[im 6/30  brain]
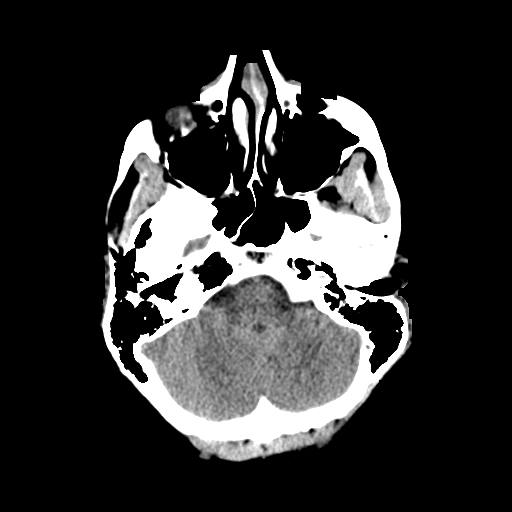
[im 9/30  brain]
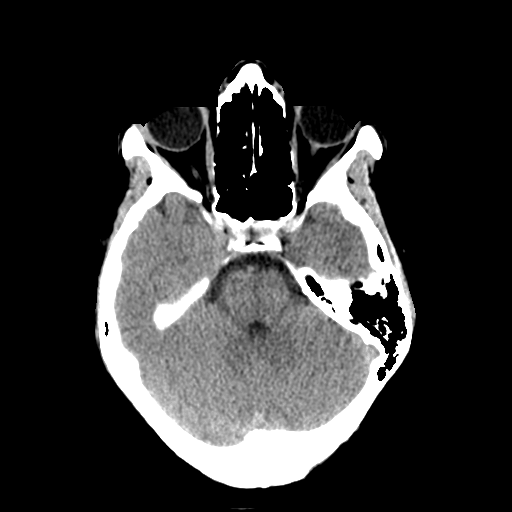
[im 12/30  brain]
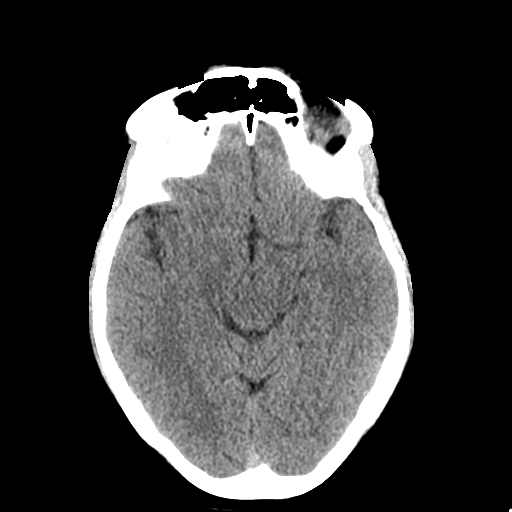
[im 16/30  brain]
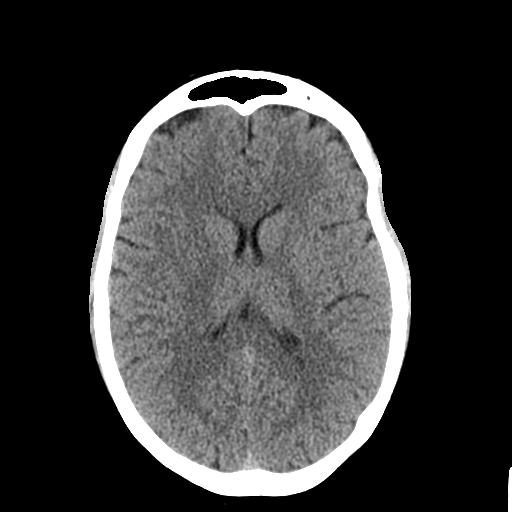
[im 16/30  bone]
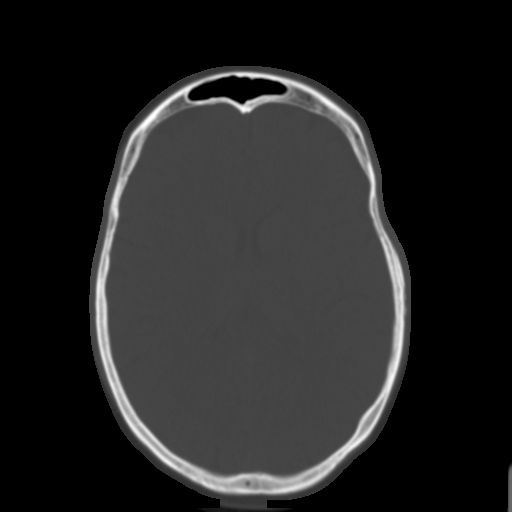
[im 19/30  brain]
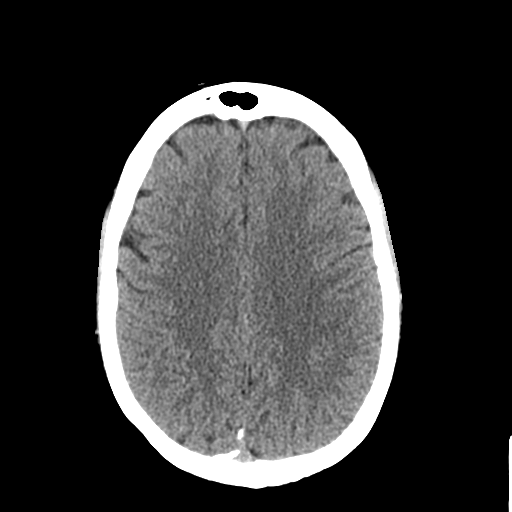
[im 22/30  brain]
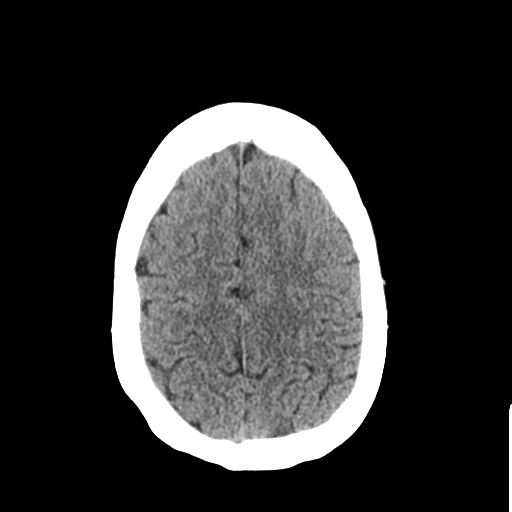
[im 25/30  brain]
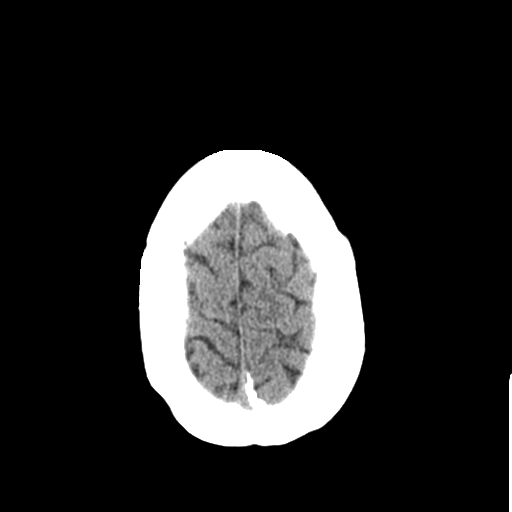
[im 28/30  brain]
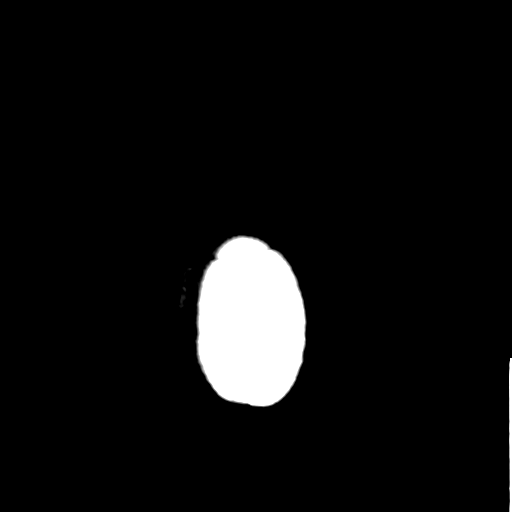
[im 28/30  bone]
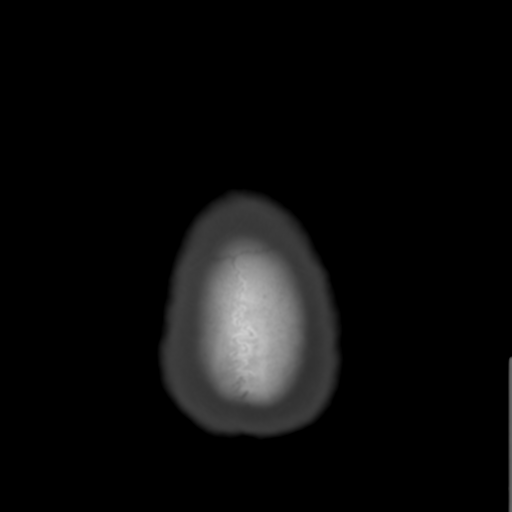

[Series 4: coronal soft tissue · coronal · 0.30mm/px · 3 of 71 slices shown]
[im 24/71  brain]
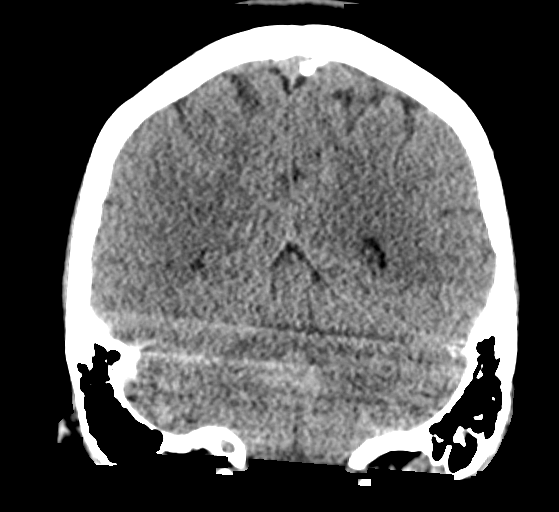
[im 32/71  brain]
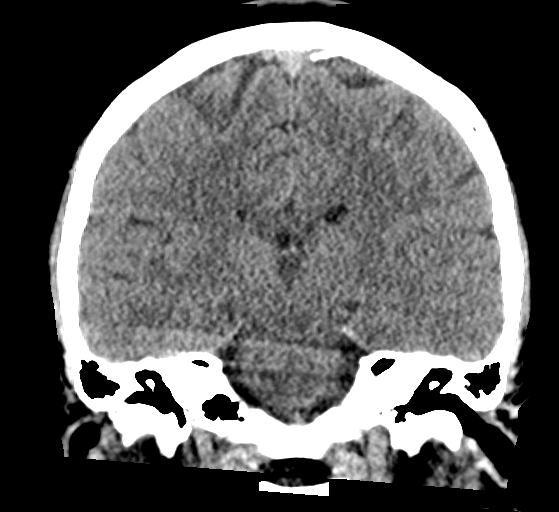
[im 39/71  brain]
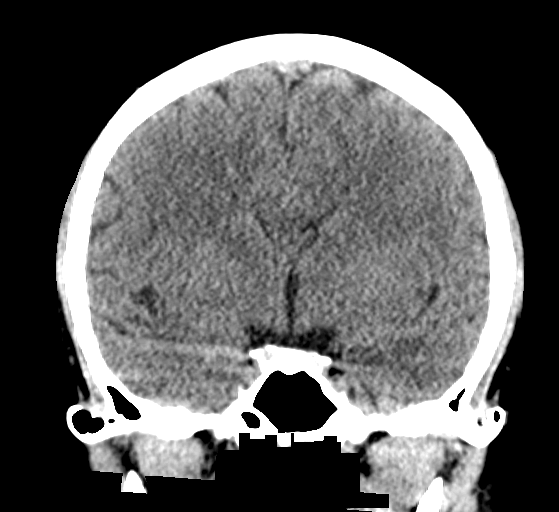

[Series 5: sagittal soft tissue · sagittal · 0.29mm/px · 3 of 56 slices shown]
[im 19/56  brain]
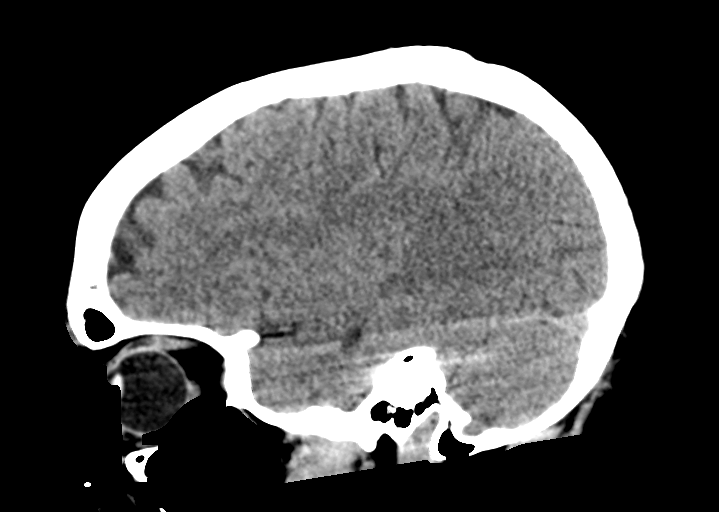
[im 28/56  brain]
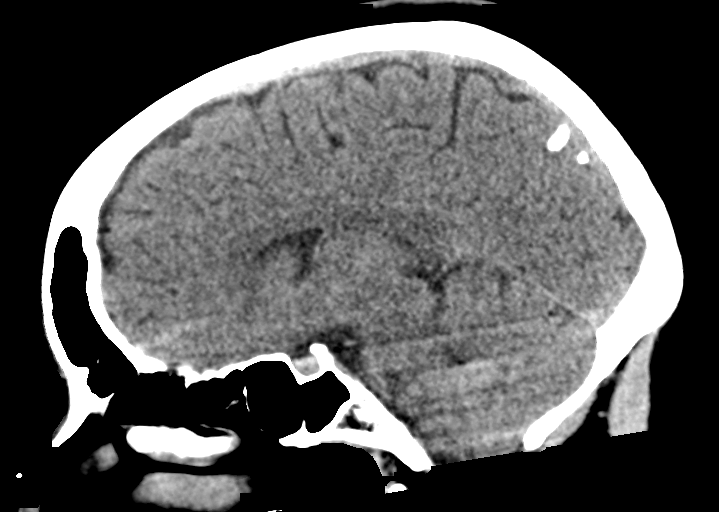
[im 37/56  brain]
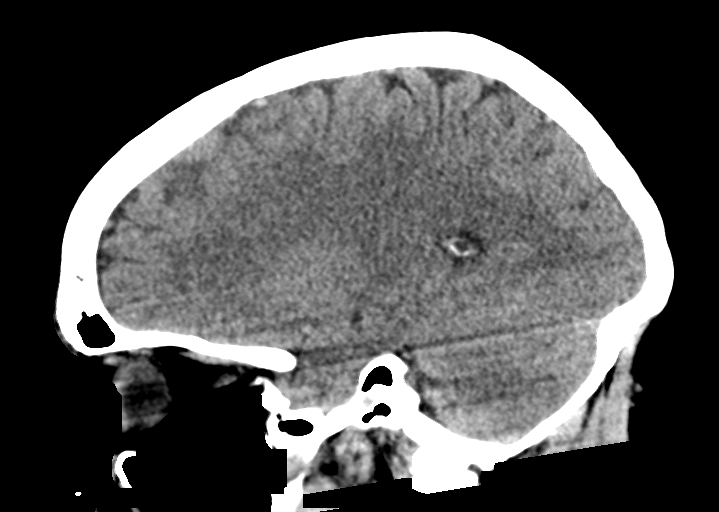

[15 of 47 positions shown; findings below may reference images not displayed]

FINDINGS: Brain: Normal appearing cerebral hemispheres and posterior fossa
structures. Normal size and position of the ventricles. No
intracranial hemorrhage, mass lesion or CT evidence of acute
infarction.

Vascular: No hyperdense vessel or unexpected calcification.

Skull: Normal. Negative for fracture or focal lesion.

Sinuses/Orbits: Unremarkable.

Other: None.
IMPRESSION: Normal examination.

## 2022-10-18 DIAGNOSIS — F4310 Post-traumatic stress disorder, unspecified: Secondary | ICD-10-CM | POA: Diagnosis not present

## 2022-11-01 DIAGNOSIS — F4310 Post-traumatic stress disorder, unspecified: Secondary | ICD-10-CM | POA: Diagnosis not present

## 2022-11-01 DIAGNOSIS — F23 Brief psychotic disorder: Secondary | ICD-10-CM | POA: Diagnosis not present

## 2022-11-08 DIAGNOSIS — F4310 Post-traumatic stress disorder, unspecified: Secondary | ICD-10-CM | POA: Diagnosis not present

## 2022-11-08 DIAGNOSIS — F23 Brief psychotic disorder: Secondary | ICD-10-CM | POA: Diagnosis not present

## 2022-11-22 DIAGNOSIS — F4310 Post-traumatic stress disorder, unspecified: Secondary | ICD-10-CM | POA: Diagnosis not present

## 2022-11-22 DIAGNOSIS — F23 Brief psychotic disorder: Secondary | ICD-10-CM | POA: Diagnosis not present

## 2022-12-06 DIAGNOSIS — F4310 Post-traumatic stress disorder, unspecified: Secondary | ICD-10-CM | POA: Diagnosis not present

## 2022-12-06 DIAGNOSIS — F23 Brief psychotic disorder: Secondary | ICD-10-CM | POA: Diagnosis not present

## 2022-12-12 DIAGNOSIS — F39 Unspecified mood [affective] disorder: Secondary | ICD-10-CM | POA: Diagnosis not present

## 2022-12-12 DIAGNOSIS — F29 Unspecified psychosis not due to a substance or known physiological condition: Secondary | ICD-10-CM | POA: Diagnosis not present

## 2022-12-16 ENCOUNTER — Other Ambulatory Visit: Payer: Self-pay

## 2022-12-16 DIAGNOSIS — F3181 Bipolar II disorder: Secondary | ICD-10-CM | POA: Diagnosis not present

## 2022-12-16 DIAGNOSIS — F29 Unspecified psychosis not due to a substance or known physiological condition: Secondary | ICD-10-CM | POA: Diagnosis not present

## 2022-12-16 DIAGNOSIS — F4312 Post-traumatic stress disorder, chronic: Secondary | ICD-10-CM | POA: Diagnosis not present

## 2022-12-19 ENCOUNTER — Other Ambulatory Visit: Payer: Self-pay

## 2022-12-19 MED ORDER — CAPLYTA 10.5 MG PO CAPS
10.5000 mg | ORAL_CAPSULE | Freq: Every day | ORAL | 0 refills | Status: DC
  Filled 2022-12-19 – 2022-12-20 (×2): qty 30, 30d supply, fill #0

## 2022-12-20 ENCOUNTER — Other Ambulatory Visit: Payer: Self-pay

## 2022-12-20 DIAGNOSIS — F23 Brief psychotic disorder: Secondary | ICD-10-CM | POA: Diagnosis not present

## 2022-12-20 DIAGNOSIS — F4310 Post-traumatic stress disorder, unspecified: Secondary | ICD-10-CM | POA: Diagnosis not present

## 2022-12-21 ENCOUNTER — Other Ambulatory Visit: Payer: Self-pay

## 2022-12-28 DIAGNOSIS — F29 Unspecified psychosis not due to a substance or known physiological condition: Secondary | ICD-10-CM | POA: Diagnosis not present

## 2022-12-28 DIAGNOSIS — F4312 Post-traumatic stress disorder, chronic: Secondary | ICD-10-CM | POA: Diagnosis not present

## 2022-12-28 DIAGNOSIS — F3181 Bipolar II disorder: Secondary | ICD-10-CM | POA: Diagnosis not present

## 2023-01-03 DIAGNOSIS — F4310 Post-traumatic stress disorder, unspecified: Secondary | ICD-10-CM | POA: Diagnosis not present

## 2023-01-03 DIAGNOSIS — F23 Brief psychotic disorder: Secondary | ICD-10-CM | POA: Diagnosis not present

## 2023-01-16 ENCOUNTER — Other Ambulatory Visit: Payer: Self-pay

## 2023-01-16 MED ORDER — CAPLYTA 10.5 MG PO CAPS
10.5000 mg | ORAL_CAPSULE | Freq: Every evening | ORAL | 1 refills | Status: DC
  Filled 2023-01-16: qty 30, 30d supply, fill #0

## 2023-01-17 ENCOUNTER — Other Ambulatory Visit: Payer: Self-pay

## 2023-01-17 DIAGNOSIS — F23 Brief psychotic disorder: Secondary | ICD-10-CM | POA: Diagnosis not present

## 2023-01-17 DIAGNOSIS — F4310 Post-traumatic stress disorder, unspecified: Secondary | ICD-10-CM | POA: Diagnosis not present

## 2023-01-18 ENCOUNTER — Other Ambulatory Visit: Payer: Self-pay

## 2023-02-03 DIAGNOSIS — F3181 Bipolar II disorder: Secondary | ICD-10-CM | POA: Diagnosis not present

## 2023-02-03 DIAGNOSIS — F29 Unspecified psychosis not due to a substance or known physiological condition: Secondary | ICD-10-CM | POA: Diagnosis not present

## 2023-02-03 DIAGNOSIS — F4312 Post-traumatic stress disorder, chronic: Secondary | ICD-10-CM | POA: Diagnosis not present

## 2023-02-04 ENCOUNTER — Other Ambulatory Visit: Payer: Self-pay

## 2023-02-05 ENCOUNTER — Other Ambulatory Visit: Payer: Self-pay

## 2023-02-05 MED ORDER — INGREZZA 40 MG PO CAPS
40.0000 mg | ORAL_CAPSULE | Freq: Every day | ORAL | 1 refills | Status: DC
  Filled 2023-02-05 – 2023-02-10 (×2): qty 30, 30d supply, fill #0

## 2023-02-06 ENCOUNTER — Other Ambulatory Visit: Payer: Self-pay

## 2023-02-10 ENCOUNTER — Other Ambulatory Visit: Payer: Self-pay

## 2023-02-13 ENCOUNTER — Other Ambulatory Visit: Payer: Self-pay

## 2023-02-14 DIAGNOSIS — F23 Brief psychotic disorder: Secondary | ICD-10-CM | POA: Diagnosis not present

## 2023-02-14 DIAGNOSIS — F4310 Post-traumatic stress disorder, unspecified: Secondary | ICD-10-CM | POA: Diagnosis not present

## 2023-02-27 ENCOUNTER — Other Ambulatory Visit: Payer: Self-pay

## 2023-02-28 DIAGNOSIS — F23 Brief psychotic disorder: Secondary | ICD-10-CM | POA: Diagnosis not present

## 2023-02-28 DIAGNOSIS — F4310 Post-traumatic stress disorder, unspecified: Secondary | ICD-10-CM | POA: Diagnosis not present

## 2023-03-14 DIAGNOSIS — F4310 Post-traumatic stress disorder, unspecified: Secondary | ICD-10-CM | POA: Diagnosis not present

## 2023-03-14 DIAGNOSIS — F23 Brief psychotic disorder: Secondary | ICD-10-CM | POA: Diagnosis not present

## 2023-03-28 DIAGNOSIS — F4310 Post-traumatic stress disorder, unspecified: Secondary | ICD-10-CM | POA: Diagnosis not present

## 2023-03-28 DIAGNOSIS — F23 Brief psychotic disorder: Secondary | ICD-10-CM | POA: Diagnosis not present

## 2023-04-25 DIAGNOSIS — F4310 Post-traumatic stress disorder, unspecified: Secondary | ICD-10-CM | POA: Diagnosis not present

## 2023-04-25 DIAGNOSIS — F23 Brief psychotic disorder: Secondary | ICD-10-CM | POA: Diagnosis not present

## 2023-05-09 DIAGNOSIS — F4310 Post-traumatic stress disorder, unspecified: Secondary | ICD-10-CM | POA: Diagnosis not present

## 2023-05-09 DIAGNOSIS — F23 Brief psychotic disorder: Secondary | ICD-10-CM | POA: Diagnosis not present

## 2023-05-23 DIAGNOSIS — F23 Brief psychotic disorder: Secondary | ICD-10-CM | POA: Diagnosis not present

## 2023-05-23 DIAGNOSIS — F4310 Post-traumatic stress disorder, unspecified: Secondary | ICD-10-CM | POA: Diagnosis not present

## 2023-05-26 ENCOUNTER — Other Ambulatory Visit: Payer: Self-pay

## 2023-05-26 ENCOUNTER — Encounter: Payer: Self-pay | Admitting: Pharmacist

## 2023-05-26 MED ORDER — ELLA 30 MG PO TABS
ORAL_TABLET | ORAL | 0 refills | Status: DC
  Filled 2023-05-26: qty 2, 2d supply, fill #0

## 2023-05-29 ENCOUNTER — Other Ambulatory Visit: Payer: Self-pay

## 2023-05-30 ENCOUNTER — Other Ambulatory Visit: Payer: Self-pay

## 2023-06-06 DIAGNOSIS — F23 Brief psychotic disorder: Secondary | ICD-10-CM | POA: Diagnosis not present

## 2023-06-06 DIAGNOSIS — F4310 Post-traumatic stress disorder, unspecified: Secondary | ICD-10-CM | POA: Diagnosis not present

## 2023-06-20 DIAGNOSIS — F23 Brief psychotic disorder: Secondary | ICD-10-CM | POA: Diagnosis not present

## 2023-06-20 DIAGNOSIS — F4310 Post-traumatic stress disorder, unspecified: Secondary | ICD-10-CM | POA: Diagnosis not present

## 2023-07-04 DIAGNOSIS — F23 Brief psychotic disorder: Secondary | ICD-10-CM | POA: Diagnosis not present

## 2023-07-04 DIAGNOSIS — F4310 Post-traumatic stress disorder, unspecified: Secondary | ICD-10-CM | POA: Diagnosis not present

## 2023-07-21 DIAGNOSIS — F23 Brief psychotic disorder: Secondary | ICD-10-CM | POA: Diagnosis not present

## 2023-07-21 DIAGNOSIS — F4310 Post-traumatic stress disorder, unspecified: Secondary | ICD-10-CM | POA: Diagnosis not present

## 2023-08-15 DIAGNOSIS — F23 Brief psychotic disorder: Secondary | ICD-10-CM | POA: Diagnosis not present

## 2023-08-15 DIAGNOSIS — F4310 Post-traumatic stress disorder, unspecified: Secondary | ICD-10-CM | POA: Diagnosis not present

## 2023-08-23 ENCOUNTER — Emergency Department
Admission: EM | Admit: 2023-08-23 | Discharge: 2023-08-23 | Disposition: A | Discharge status: Home / Self Care | Attending: Emergency Medicine | Admitting: Emergency Medicine

## 2023-08-23 ENCOUNTER — Other Ambulatory Visit: Payer: Self-pay

## 2023-08-23 DIAGNOSIS — R42 Dizziness and giddiness: Secondary | ICD-10-CM | POA: Insufficient documentation

## 2023-08-23 LAB — T4, FREE: Free T4: 0.82 ng/dL (ref 0.61–1.12)

## 2023-08-23 LAB — COMPREHENSIVE METABOLIC PANEL WITH GFR
ALT: 17 U/L (ref 0–44)
AST: 17 U/L (ref 15–41)
Albumin: 4 g/dL (ref 3.5–5.0)
Alkaline Phosphatase: 65 U/L (ref 38–126)
Anion gap: 7 (ref 5–15)
BUN: 9 mg/dL (ref 6–20)
CO2: 25 mmol/L (ref 22–32)
Calcium: 9.1 mg/dL (ref 8.9–10.3)
Chloride: 103 mmol/L (ref 98–111)
Creatinine, Ser: 0.7 mg/dL (ref 0.44–1.00)
GFR, Estimated: >60 mL/min (ref >60)
Glucose, Bld: 129 mg/dL — ABNORMAL HIGH (ref 70–99)
Potassium: 4.3 mmol/L (ref 3.5–5.1)
Sodium: 135 mmol/L (ref 135–145)
Total Bilirubin: 0.7 mg/dL (ref 0.0–1.2)
Total Protein: 7.2 g/dL (ref 6.5–8.1)

## 2023-08-23 LAB — CBC
HCT: 44.3 % (ref 36.0–46.0)
Hemoglobin: 14.5 g/dL (ref 12.0–15.0)
MCH: 30.3 pg (ref 26.0–34.0)
MCHC: 32.7 g/dL (ref 30.0–36.0)
MCV: 92.5 fL (ref 80.0–100.0)
Platelets: 257 10*3/uL (ref 150–400)
RBC: 4.79 MIL/uL (ref 3.87–5.11)
RDW: 12.5 % (ref 11.5–15.5)
WBC: 8.4 10*3/uL (ref 4.0–10.5)
nRBC: 0 % (ref 0.0–0.2)

## 2023-08-23 LAB — HCG, QUANTITATIVE, PREGNANCY: hCG, Beta Chain, Quant, S: <1 m[IU]/mL (ref <5)

## 2023-08-23 LAB — TSH: TSH: 0.761 u[IU]/mL (ref 0.350–4.500)

## 2023-08-23 LAB — CBG MONITORING, ED: Glucose-Capillary: 116 mg/dL — ABNORMAL HIGH (ref 70–99)

## 2023-08-23 NOTE — ED Provider Notes (Signed)
 [facility] Provider Note    Event Date/Time   First MD Initiated Contact with Patient 08/23/23 1609     (approximate)   History   Near Syncope   HPI Sue Jones is a 28 y.o. female presenting today for lightheadedness.  Patient states over the past week she has intermittent episodes where she gets lightheaded/dizzy, blurry vision.  She also feels like she is experiencing episodes of dj vu.  She otherwise denies nausea, vomiting, abdominal pain, chest pain, shortness of breath, fever, headaches, numbness, weakness, dysuria.  Prior symptoms like this in the past.  Does have a history of bipolar 1, depression, anxiety but not currently on any medication.  Denies alcohol or other drug use.     Physical Exam   Triage Vital Signs: ED Triage Vitals  Encounter Vitals Group     BP 08/23/23 1324 (!) 137/97     Systolic BP Percentile --      Diastolic BP Percentile --      Pulse Rate 08/23/23 1324 95     Resp 08/23/23 1324 20     Temp 08/23/23 1324 98.8 F (37.1 C)     Temp Source 08/23/23 1324 Oral     SpO2 08/23/23 1324 99 %     Weight 08/23/23 1326 135 lb (61.2 kg)     Height 08/23/23 1326 5\' 7"  (1.702 m)     Head Circumference --      Peak Flow --      Pain Score 08/23/23 1322 0     Pain Loc --      Pain Education --      Exclude from Growth Chart --     Most recent vital signs: Vitals:   08/23/23 1324  BP: (!) 137/97  Pulse: 95  Resp: 20  Temp: 98.8 F (37.1 C)  SpO2: 99%   Physical Exam: I have reviewed the vital signs and nursing notes. General: Awake, alert, no acute distress.  Nontoxic appearing. Head:  Atraumatic, normocephalic.   ENT:  EOM intact, PERRL. Oral mucosa is pink and moist with no lesions. Neck: Neck is supple with full range of motion, No meningeal signs. Cardiovascular:  RRR, No murmurs. Peripheral pulses palpable and equal bilaterally. Respiratory:  Symmetrical chest wall expansion.  No rhonchi, rales, or  wheezes.  Good air movement throughout.  No use of accessory muscles.   Musculoskeletal:  No cyanosis or edema. Moving extremities with full ROM Abdomen:  Soft, nontender, nondistended. Neuro:  GCS 15, moving all four extremities, interacting appropriately. Speech clear. Psych:  Calm, appropriate.   Skin:  Warm, dry, no rash.    ED Results / Procedures / Treatments   Labs (all labs ordered are listed, but only abnormal results are displayed) Labs Reviewed  COMPREHENSIVE METABOLIC PANEL WITH GFR - Abnormal; Notable for the following components:      Result Value   Glucose, Bld 129 (*)    All other components within normal limits  CBG MONITORING, ED - Abnormal; Notable for the following components:   Glucose-Capillary 116 (*)    All other components within normal limits  CBC  TSH  T4, FREE  HCG, QUANTITATIVE, PREGNANCY     EKG My EKG interpretation: Rate of 57, normal sinus rhythm, normal axis, normal intervals.  No acute ST elevations or depressions   RADIOLOGY    PROCEDURES:  Critical Care performed: No  Procedures   MEDICATIONS ORDERED IN ED: Medications - No data to display  IMPRESSION / MDM / ASSESSMENT AND PLAN / ED COURSE  I reviewed the triage vital signs and the nursing notes.                              Differential diagnosis includes, but is not limited to, orthostatic hypotension, migraines, tension headaches, stress, anxiety  Patient's presentation is most consistent with acute complicated illness / injury requiring diagnostic workup.  Patient is a 28 year old female presenting today for episodes of lightheaded/dizziness with associated blurred vision and dj vu symptoms.  Currently asymptomatic and vital signs stable.  Physical exam unremarkable with normal neurological exam.  EKG reassuring with no abnormal arrhythmias.  CBC and CMP unremarkable.  Thyroid  panel negative.  Pregnancy test normal.  Do not have clear etiology of her symptoms although  she is currently asymptomatic.  Do not see any life-threatening causes.  Unsure if this is blood pressure related or potentially stress or anxiety contributing to this although she denies any worsening stress recently.  She is otherwise safe for discharge and has follow-up with her PCP planned in the next couple weeks.     FINAL CLINICAL IMPRESSION(S) / ED DIAGNOSES   Final diagnoses:  Lightheadedness     Rx / DC Orders   ED Discharge Orders     None        Note:  This document was prepared using Dragon voice recognition software and may include unintentional dictation errors.   Kandee Orion, MD 08/23/23 [phone]

## 2023-08-23 NOTE — ED Triage Notes (Signed)
 Pt to ED for several episodes of near syncope this week, once today. States gets dizzy and vision gets blurry (both eyes). States not dizzy right now. No chest pain or SOB when it happens. States feels mentally fatigued and like having "deja-vu" all week, "like everything that is already happening has already happened".  Is eating and drinking. Denies pregnancy. CBG is 116.

## 2023-08-23 NOTE — Discharge Instructions (Signed)
 Your laboratory workup was reassuring here today.  Please follow-up with your primary care provider as planned.  Please keep track of your symptoms going forward as well.

## 2023-09-08 ENCOUNTER — Other Ambulatory Visit: Payer: Self-pay

## 2023-09-08 MED ORDER — ELLA 30 MG PO TABS
30.0000 mg | ORAL_TABLET | ORAL | 1 refills | Status: DC
  Filled 2023-09-08: qty 2, 31d supply, fill #0

## 2023-09-08 MED ORDER — NORETHINDRONE 0.35 MG PO TABS
1.0000 | ORAL_TABLET | Freq: Every day | ORAL | 0 refills | Status: DC
Start: 2023-09-08 — End: 2023-09-15
  Filled 2023-09-08: qty 28, 28d supply, fill #0

## 2023-09-12 ENCOUNTER — Other Ambulatory Visit: Payer: Self-pay

## 2023-09-12 DIAGNOSIS — F23 Brief psychotic disorder: Secondary | ICD-10-CM | POA: Diagnosis not present

## 2023-09-12 DIAGNOSIS — F4310 Post-traumatic stress disorder, unspecified: Secondary | ICD-10-CM | POA: Diagnosis not present

## 2023-09-15 ENCOUNTER — Ambulatory Visit: Admitting: Student

## 2023-09-15 ENCOUNTER — Other Ambulatory Visit: Payer: Self-pay

## 2023-09-15 ENCOUNTER — Encounter: Payer: Self-pay | Admitting: Student

## 2023-09-15 ENCOUNTER — Other Ambulatory Visit (HOSPITAL_COMMUNITY)
Admission: RE | Admit: 2023-09-15 | Discharge: 2023-09-15 | Disposition: A | Discharge status: Home / Self Care | Source: Ambulatory Visit | Attending: Student | Admitting: Student

## 2023-09-15 VITALS — BP 118/66 | HR 74 | Ht 67.0 in | Wt 143.0 lb

## 2023-09-15 DIAGNOSIS — R87622 Low grade squamous intraepithelial lesion on cytologic smear of vagina (LGSIL): Secondary | ICD-10-CM | POA: Insufficient documentation

## 2023-09-15 DIAGNOSIS — Z124 Encounter for screening for malignant neoplasm of cervix: Secondary | ICD-10-CM | POA: Insufficient documentation

## 2023-09-15 DIAGNOSIS — F39 Unspecified mood [affective] disorder: Secondary | ICD-10-CM | POA: Diagnosis not present

## 2023-09-15 DIAGNOSIS — Z0000 Encounter for general adult medical examination without abnormal findings: Secondary | ICD-10-CM | POA: Diagnosis not present

## 2023-09-15 DIAGNOSIS — Z975 Presence of (intrauterine) contraceptive device: Secondary | ICD-10-CM | POA: Diagnosis not present

## 2023-09-15 NOTE — Assessment & Plan Note (Signed)
 Mood is stable, currently in therapy and not on medications. Continue to monitor for changes.

## 2023-09-15 NOTE — Assessment & Plan Note (Signed)
 Reports this was placed in 2019. Would like it removed. Referral made to GYN for this. Is using progestin only pill for contraception which she gets through a online prescriber.

## 2023-09-15 NOTE — Assessment & Plan Note (Signed)
 Noted on pap in 2019. Has not had a pap smear since then. Pap performed today. No obvious lesions or abnormality on exam

## 2023-09-15 NOTE — Assessment & Plan Note (Addendum)
 Reviewed recent labs including CBC, TSH, and CMP from ED and normal aside from elevated glucose.  A1c today and lipid panel today Discussed healthy diet and exercise Pap obtained today.

## 2023-09-15 NOTE — Progress Notes (Signed)
 New Patient Office Visit  Subjective    Patient ID: Sue Jones, female    DOB: [birth year]  Age: 28 y.o. MRN: [id number]  CC:  Chief Complaint  Patient presents with   Establish Care   Dizziness    Went to ER 08/23/23 everything has gotten better, has not happened since then     HPI Sue Jones is a 28 year old person presents to establish care.  Patients reports long history of psychiatric disturbance and has been on multiple medications but tapered off due to significant side effects including tardive dyskinesia and oculogyric crisis. Not seeing a psychiatrist anymore. Still going to therapy monthly. Reports mood is stable and denies SI, HI, or AVD.   Lightheadedness  Recent ED visit for lightheadedness, presyncope for a few minutes follow up persistent ringing in ears and blurred vision for about 1 week. Reports she now feels well and has not had further episodes. ED workup with CBC, CMP, TSH, bHCG, and EKG were unremarkable. She denies fever, chills, increased stress, changes in diet, or hearing loss.  Started new job in [place] feels it is much less stress. 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 Denies sleep disturbance. Currently lives with boyfriend.    Outpatient Encounter Medications as of 09/15/2023  Medication Sig   Etonogestrel (NEXPLANON [place]) Inject into the skin.   [DISCONTINUED] citalopram  (CELEXA ) 20 MG tablet TAKE 1 TABLET BY MOUTH EVERY DAY   [DISCONTINUED] clonazePAM (KLONOPIN) 0.5 MG tablet TAKE ONE TABLET BY MOUTH DAILY AS NEEDED   [DISCONTINUED] Deutetrabenazine  (AUSTEDO ) 6 MG TABS take one tablet orally BID   [DISCONTINUED] ibuprofen  (ADVIL ) 800 MG tablet Take 1 tablet (800 mg total) by mouth every 8 (eight) hours as needed.   [DISCONTINUED] lumateperone  tosylate (CAPLYTA ) 10.5 MG capsule Take 1 capsule by mouth once daily at bedtime.   [DISCONTINUED] neomycin -polymyxin b-dexamethasone  (MAXITROL ) 3.5-10000-0.1 SUSP Instill 1 drop into left  eye four times a day   [DISCONTINUED] NICOTINE  STEP 3 7 MG/24HR patch PLACE 1 PATCH ON THE SKIN DAILY. PLEASE PLACE 2 PATCHES DAILY (PLACE FOR 12HRS, REMOVE AT BEDTIME) FOR 1 WEEK, THEN PLACE 1 DAILY FOR 2WKS.   [DISCONTINUED] norethindrone (MICRONOR) 0.35 MG tablet Take 1 tablet (0.35 mg total) by mouth daily at the same time.   [DISCONTINUED] ondansetron  (ZOFRAN -ODT) 4 MG disintegrating tablet Take 1 tablet (4 mg total) by mouth every 8 (eight) hours as needed for nausea or vomiting.   [DISCONTINUED] PROAIR  HFA 108 (90 Base) MCG/ACT inhaler TAKE 2 PUFFS BY MOUTH EVERY 6 HOURS AS NEEDED FOR WHEEZE   [DISCONTINUED] sertraline  (ZOLOFT ) 100 MG tablet TAKE ONE TABLET BY MOUTH EVERY MORNING   [DISCONTINUED] sertraline  (ZOLOFT ) 100 MG tablet take one tab orally q am   [DISCONTINUED] sertraline  (ZOLOFT ) 100 MG tablet Take 1 & 1/2 tablets by mouth once daily   [DISCONTINUED] sertraline  (ZOLOFT ) 50 MG tablet TAKE ONE TABLET BY MOUTH DAILY   [DISCONTINUED] sertraline  (ZOLOFT ) 50 MG tablet TAKE 1 TABLET BY MOUTH AT BEDTIME   [DISCONTINUED] tamsulosin  (FLOMAX ) 0.4 MG CAPS capsule Take 1 capsule (0.4 mg total) by mouth 1 (one) time each day for 7 days.   [DISCONTINUED] topiramate  (TOPAMAX ) 25 MG tablet TAKE 1 TABLET BY MOUTH DAILY   [DISCONTINUED] topiramate  (TOPAMAX ) 50 MG tablet TAKE ONE TABLET BY MOUTH ONCE A DAY   [DISCONTINUED] topiramate  (TOPAMAX ) 50 MG tablet TAKE ONE TABLET BY MOUTH DAILY   [DISCONTINUED] topiramate  (TOPAMAX ) 50 MG tablet TAKE ONE TABLET  BY MOUTH DAILY   [DISCONTINUED] topiramate  (TOPAMAX ) 50 MG tablet take one tab orally q day   [DISCONTINUED] traZODone  (DESYREL ) 100 MG tablet TAKE 2 TABLETS BY MOUTH AT BEDTIME   [DISCONTINUED] traZODone  (DESYREL ) 100 MG tablet TAKE ONE TO TWO TABLETS BY MOUTH AT BEDTIME   [DISCONTINUED] traZODone  (DESYREL ) 100 MG tablet Take 1 to 2 tablets by mouth daily as needed.   [DISCONTINUED] traZODone  (DESYREL ) 100 MG tablet Take 1 to 2 tablets by mouth at  bedtime   [DISCONTINUED] traZODone  (DESYREL ) 50 MG tablet TAKE ONE TO TWO TABLETS AT BEDTIME   [DISCONTINUED] ulipristal acetate  (ELLA ) 30 MG tablet Take 1 tablet by mouth as single dose   [DISCONTINUED] ulipristal acetate  (ELLA ) 30 MG tablet Take 1 tablet (30 mg total) by mouth as directed as a single dose   [DISCONTINUED] valbenazine  (INGREZZA ) 40 MG capsule Take one capsule orally q hs   [DISCONTINUED] valbenazine  (INGREZZA ) 40 MG capsule Take 1 capsule (40 mg total) by mouth at bedtime.   [DISCONTINUED] valbenazine  (INGREZZA ) 60 MG capsule Take one capsule orally q pm   [DISCONTINUED] ziprasidone  (GEODON ) 20 MG capsule Take one capsule orally q am   [DISCONTINUED] ziprasidone  (GEODON ) 40 MG capsule TAKE ONE CAPSULE BY MOUTH 2 TIMES DAILY   [DISCONTINUED] ziprasidone  (GEODON ) 40 MG capsule TAKE ONE CAPSULE BY MOUTH TWO TIMES DAILY   [DISCONTINUED] ziprasidone  (GEODON ) 40 MG capsule TAKE 1 CAPSULE BY MOUTH TWICE DAILY   [DISCONTINUED] ziprasidone  (GEODON ) 40 MG capsule take one capsule orally BID   No facility-administered encounter medications on file as of 09/15/2023.    Past Medical History:  Diagnosis Date   Anxiety    Bipolar 1 disorder (HCC)    Depression     History reviewed. No pertinent surgical history.  Family History  Problem Relation Age of Onset   Heart attack Mother    Cancer Maternal Grandmother        breast and skin   Diabetes Maternal Grandmother    Heart disease Maternal Grandmother    Heart attack Maternal Grandmother    Diabetes Maternal Grandfather    Cancer Maternal Grandfather        lung   Heart disease Maternal Grandfather    Cancer Paternal Grandfather        blood   COPD Neg Hx    Hypertension Neg Hx    Stroke Neg Hx     Social History   Socioeconomic History   Marital status: Single    Spouse name: Not on file   Number of children: 0   Years of education: Not on file   Highest education level: Not on file  Occupational History   Not  on file  Tobacco Use   Smoking status: Former    Current packs/day: 0.00    Average packs/day: 0.3 packs/day for 8.8 years (2.7 ttl pk-yrs)    Types: Cigarettes    Start date: 2016    Quit date: 02/2023    Years since quitting: 0.5   Smokeless tobacco: Never  Vaping Use   Vaping status: Never Used  Substance and Sexual Activity   Alcohol use: Not Currently   Drug use: Not Currently    Types: Marijuana    Comment: used to use edible gummies   Sexual activity: Yes    Birth control/protection: OCP    Comment: 1 males partner  Other Topics Concern   Not on file  Social History Narrative   Not on file   Social Drivers  of Health   Financial Resource Strain: Not on file  Food Insecurity: No Food Insecurity (09/15/2023)   Hunger Vital Sign    Worried About Running Out of Food in the Last Year: Never true    Ran Out of Food in the Last Year: Never true  Transportation Needs: No Transportation Needs (09/15/2023)   PRAPARE - Administrator, Civil Service (Medical): No    Lack of Transportation (Non-Medical): No  Physical Activity: Not on file  Stress: Not on file  Social Connections: Not on file  Intimate Partner Violence: Not At Risk (09/15/2023)   Humiliation, Afraid, Rape, and Kick questionnaire    Fear of Current or Ex-Partner: No    Emotionally Abused: No    Physically Abused: No    Sexually Abused: No    ROS Refer to HPI    Objective   BP 118/66   Pulse 74   Ht 5\' 7"  (1.702 m)   Wt 143 lb (64.9 kg)   SpO2 99%   BMI 22.40 kg/m   Physical Exam Chaperone present: declined chaperone.  Constitutional:      Appearance: Normal appearance.  HENT:     Right Ear: Tympanic membrane and external ear normal.     Left Ear: Tympanic membrane and external ear normal.     Mouth/Throat:     Mouth: Mucous membranes are moist.     Pharynx: Oropharynx is clear.  Eyes:     Extraocular Movements: Extraocular movements intact.     Conjunctiva/sclera: Conjunctivae  normal.     Pupils: Pupils are equal, round, and reactive to light.  Neck:     Comments: No thyromegaly Cardiovascular:     Rate and Rhythm: Normal rate and regular rhythm.  Pulmonary:     Effort: Pulmonary effort is normal.     Breath sounds: No rhonchi or rales.  Abdominal:     General: Abdomen is flat. Bowel sounds are normal. There is no distension.     Palpations: Abdomen is soft.     Tenderness: There is no abdominal tenderness.  Genitourinary:    Vagina: Normal. No vaginal discharge.     Cervix: Normal.  Musculoskeletal:        General: Normal range of motion.     Cervical back: Normal range of motion. No tenderness.     Right lower leg: No edema.     Left lower leg: No edema.  Skin:    General: Skin is warm and dry.     Capillary Refill: Capillary refill takes less than 2 seconds.  Neurological:     General: No focal deficit present.     Mental Status: She is alert and oriented to person, place, and time.  Psychiatric:        Mood and Affect: Mood normal.        Behavior: Behavior normal.        09/15/2023    2:08 PM 05/16/2018    9:39 AM 04/05/2018   11:45 AM  Depression screen PHQ 2/9  Decreased Interest 0 1 0  Down, Depressed, Hopeless 0 3 2  PHQ - 2 Score 0 4 2  Altered sleeping 1 3 3   Tired, decreased energy 0 3 1  Change in appetite 0 2 3  Feeling bad or failure about yourself  0 0 2  Trouble concentrating 1 1 0  Moving slowly or fidgety/restless 0 0 3  Suicidal thoughts 0 1 0  PHQ-9 Score 2 14 14  Difficult doing work/chores Not difficult at all        09/15/2023    2:08 PM 05/16/2018    9:40 AM 04/05/2018   11:45 AM 02/03/2017   11:23 AM  GAD 7 : Generalized Anxiety Score  Nervous, Anxious, on Edge 1 1 3 3   Control/stop worrying 1 3 2 2   Worry too much - different things 1 2 1 2   Trouble relaxing 0 0 3 3  Restless 0 0 1 0  Easily annoyed or irritable 2 3 1 3   Afraid - awful might happen 1 1 3 1   Total GAD 7 Score 6 10 14 14   Anxiety  Difficulty Somewhat difficult   Somewhat difficult    Last CBC Lab Results  Component Value Date   WBC 8.4 08/23/2023   HGB 14.5 08/23/2023   HCT 44.3 08/23/2023   MCV 92.5 08/23/2023   MCH 30.3 08/23/2023   RDW 12.5 08/23/2023   PLT 257 08/23/2023   Last metabolic panel Lab Results  Component Value Date   GLUCOSE 129 (H) 08/23/2023   NA 135 08/23/2023   K 4.3 08/23/2023   CL 103 08/23/2023   CO2 25 08/23/2023   BUN 9 08/23/2023   CREATININE 0.70 08/23/2023   GFRNONAA >60 08/23/2023   CALCIUM 9.1 08/23/2023   PROT 7.2 08/23/2023   ALBUMIN 4.0 08/23/2023   LABGLOB 2.2 05/16/2018   AGRATIO 2.0 05/16/2018   BILITOT 0.7 08/23/2023   ALKPHOS 65 08/23/2023   AST 17 08/23/2023   ALT 17 08/23/2023   ANIONGAP 7 08/23/2023   Last lipids Lab Results  Component Value Date   CHOL 141 05/16/2018   HDL 65 05/16/2018   LDLCALC 62 05/16/2018   TRIG 71 05/16/2018   Last thyroid  functions Lab Results  Component Value Date   TSH 0.761 08/23/2023        Assessment & Plan:  Annual physical exam Assessment & Plan: Reviewed recent labw ork from ED and normal aside from elevated glucose.  A1c today. Lipid panel today Discussed healthy diet and exercise Pap obtained today.    Orders: -     Hemoglobin A1c -     Lipid panel  Nexplanon in place Assessment & Plan: Reports this was placed in 2019. Would like it removed. Referral made to GYN for this. Is using progestin only pill for contraception which she gets through a online prescriber.   Orders: -     Ambulatory referral to Obstetrics / Gynecology  Screening for cervical cancer -     Cytology - PAP  Unspecified mood (affective) disorder (HCC) Assessment & Plan: Mood is stable, currently in therapy and not on medications. Continue to monitor for changes.    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 Has not had a pap smear since then. Pap  performed today. No obvious lesions or abnormality on exam     Return in about 1 year (around 09/14/2024) for physical.   Barnetta Liberty, MD

## 2023-09-16 LAB — LIPID PANEL
Chol/HDL Ratio: 2.3 ratio (ref 0.0–4.4)
Cholesterol, Total: 167 mg/dL (ref 100–199)
HDL: 72 mg/dL (ref >39)
LDL Chol Calc (NIH): 84 mg/dL (ref 0–99)
Triglycerides: 53 mg/dL (ref 0–149)
VLDL Cholesterol Cal: 11 mg/dL (ref 5–40)

## 2023-09-16 LAB — HEMOGLOBIN A1C
Est. average glucose Bld gHb Est-mCnc: 105 mg/dL
Hgb A1c MFr Bld: 5.3 % (ref 4.8–5.6)

## 2023-09-18 ENCOUNTER — Ambulatory Visit: Payer: Self-pay | Admitting: Student

## 2023-09-21 LAB — CYTOLOGY - PAP
Chlamydia: NEGATIVE
Comment: NEGATIVE
Comment: NEGATIVE
Comment: NORMAL
Diagnosis: NEGATIVE
Diagnosis: REACTIVE
Neisseria Gonorrhea: NEGATIVE
Trichomonas: NEGATIVE

## 2023-10-10 DIAGNOSIS — F23 Brief psychotic disorder: Secondary | ICD-10-CM | POA: Diagnosis not present

## 2023-10-10 DIAGNOSIS — F4310 Post-traumatic stress disorder, unspecified: Secondary | ICD-10-CM | POA: Diagnosis not present

## 2023-10-31 ENCOUNTER — Encounter: Payer: Self-pay | Admitting: Obstetrics

## 2023-10-31 ENCOUNTER — Ambulatory Visit: Admitting: Obstetrics

## 2023-10-31 VITALS — BP 125/74 | HR 71 | Ht 67.0 in | Wt 144.0 lb

## 2023-10-31 DIAGNOSIS — Z3046 Encounter for surveillance of implantable subdermal contraceptive: Secondary | ICD-10-CM | POA: Diagnosis not present

## 2023-10-31 DIAGNOSIS — Z7689 Persons encountering health services in other specified circumstances: Secondary | ICD-10-CM

## 2023-10-31 MED ORDER — ETONOGESTREL 68 MG ~~LOC~~ IMPL
68.0000 mg | DRUG_IMPLANT | Freq: Once | SUBCUTANEOUS | Status: AC
  Administered 2023-10-31: 68 mg via SUBCUTANEOUS

## 2023-10-31 NOTE — Progress Notes (Signed)
 NEXPLANON  REMOVAL AND INSERTION  SUBJECTIVE Sue Jones is a 28 y.o. [id number] who presents today for removal of her Nexplanon  and insertion of a new Nexplanon . Her Nexplanon  was placed about 5 years ago. She reports that this contraceptive method is working well for her. She has no contraindications to continued use and denies any adverse effects.  OBJECTIVE Vitals:   10/31/23 1404  BP: 125/74  Pulse: 71     Procedure Note Consent was obtained before beginning this procedure. The Nexplanon  was palpated and the surrounding skin was prepped with iodine in sterile fashion. Adequate anesthesia was achieved with subdermal injection of 1% lidocaine . A skin incision was made over the distal aspect of the device. The capsule was lysed sharply and the device was removed with a hemostat. A new Nexplanon  applicator was inserted into the incision site subdermally, and the Nexplanon  device was delivered. Proper placement was confirmed by palpation. Hemostasis was achieved. The incision site was closed with with a steristrip and a pressure dressing was applied. The patient tolerated the procedure well.  Standard post-procedure care and precautions were reviewed. The patient verbalized understanding.  Rosina Hamilton, SNM

## 2023-12-05 DIAGNOSIS — F4310 Post-traumatic stress disorder, unspecified: Secondary | ICD-10-CM | POA: Diagnosis not present

## 2023-12-05 DIAGNOSIS — F23 Brief psychotic disorder: Secondary | ICD-10-CM | POA: Diagnosis not present

## 2024-01-09 DIAGNOSIS — F23 Brief psychotic disorder: Secondary | ICD-10-CM | POA: Diagnosis not present

## 2024-01-09 DIAGNOSIS — F4310 Post-traumatic stress disorder, unspecified: Secondary | ICD-10-CM | POA: Diagnosis not present

## 2024-01-24 ENCOUNTER — Other Ambulatory Visit (HOSPITAL_BASED_OUTPATIENT_CLINIC_OR_DEPARTMENT_OTHER): Payer: Self-pay

## 2024-01-24 MED ORDER — FLUZONE 0.5 ML IM SUSY
0.5000 mL | PREFILLED_SYRINGE | Freq: Once | INTRAMUSCULAR | 0 refills | Status: AC
  Filled 2024-01-24: qty 0.5, 1d supply, fill #0

## 2024-02-06 DIAGNOSIS — F4310 Post-traumatic stress disorder, unspecified: Secondary | ICD-10-CM | POA: Diagnosis not present

## 2024-02-06 DIAGNOSIS — F23 Brief psychotic disorder: Secondary | ICD-10-CM | POA: Diagnosis not present

## 2024-02-09 ENCOUNTER — Encounter: Payer: Self-pay | Admitting: Student

## 2024-02-09 ENCOUNTER — Other Ambulatory Visit: Payer: Self-pay | Admitting: Student

## 2024-02-09 ENCOUNTER — Other Ambulatory Visit: Payer: Self-pay

## 2024-02-09 MED ORDER — SCOPOLAMINE 1 MG/3DAYS TD PT72
1.0000 | MEDICATED_PATCH | TRANSDERMAL | 0 refills | Status: AC
  Filled 2024-02-09: qty 3, 9d supply, fill #0

## 2024-02-09 NOTE — Telephone Encounter (Signed)
 Please review patient's message:

## 2024-02-09 NOTE — Telephone Encounter (Signed)
 Please review patient's response.

## 2024-02-27 DIAGNOSIS — F4310 Post-traumatic stress disorder, unspecified: Secondary | ICD-10-CM | POA: Diagnosis not present

## 2024-04-02 DIAGNOSIS — F4310 Post-traumatic stress disorder, unspecified: Secondary | ICD-10-CM | POA: Diagnosis not present

## 2024-09-16 ENCOUNTER — Encounter: Admitting: Student
